# Patient Record
Sex: Female | Born: 1965 | Race: White | Hispanic: No | Smoking: Current every day smoker
Health system: Southern US, Community
[De-identification: ages and names within clinical notes are randomized; demographics above are authoritative.]

## PROBLEM LIST (undated history)

## (undated) DIAGNOSIS — I2510 Atherosclerotic heart disease of native coronary artery without angina pectoris: Secondary | ICD-10-CM

## (undated) DIAGNOSIS — G43909 Migraine, unspecified, not intractable, without status migrainosus: Secondary | ICD-10-CM

## (undated) DIAGNOSIS — Z720 Tobacco use: Secondary | ICD-10-CM

## (undated) DIAGNOSIS — R011 Cardiac murmur, unspecified: Secondary | ICD-10-CM

## (undated) DIAGNOSIS — I10 Essential (primary) hypertension: Secondary | ICD-10-CM

## (undated) DIAGNOSIS — F329 Major depressive disorder, single episode, unspecified: Secondary | ICD-10-CM

## (undated) DIAGNOSIS — M1990 Unspecified osteoarthritis, unspecified site: Secondary | ICD-10-CM

## (undated) DIAGNOSIS — I719 Aortic aneurysm of unspecified site, without rupture: Secondary | ICD-10-CM

## (undated) DIAGNOSIS — F32A Depression, unspecified: Secondary | ICD-10-CM

## (undated) DIAGNOSIS — K219 Gastro-esophageal reflux disease without esophagitis: Secondary | ICD-10-CM

## (undated) DIAGNOSIS — G453 Amaurosis fugax: Secondary | ICD-10-CM

## (undated) DIAGNOSIS — E785 Hyperlipidemia, unspecified: Secondary | ICD-10-CM

## (undated) DIAGNOSIS — G4730 Sleep apnea, unspecified: Secondary | ICD-10-CM

## (undated) DIAGNOSIS — Z419 Encounter for procedure for purposes other than remedying health state, unspecified: Secondary | ICD-10-CM

## (undated) DIAGNOSIS — Z1211 Encounter for screening for malignant neoplasm of colon: Secondary | ICD-10-CM

## (undated) DIAGNOSIS — M545 Low back pain, unspecified: Secondary | ICD-10-CM

## (undated) DIAGNOSIS — K429 Umbilical hernia without obstruction or gangrene: Secondary | ICD-10-CM

## (undated) DIAGNOSIS — Z1231 Encounter for screening mammogram for malignant neoplasm of breast: Secondary | ICD-10-CM

## (undated) DIAGNOSIS — N6325 Unspecified lump in the left breast, overlapping quadrants: Secondary | ICD-10-CM

## (undated) HISTORY — DX: Migraine, unspecified, not intractable, without status migrainosus: G43.909

## (undated) HISTORY — DX: Hyperlipidemia, unspecified: E78.5

## (undated) HISTORY — PX: CHOLECYSTECTOMY: SHX55

## (undated) HISTORY — PX: FRACTURE SURGERY: SHX138

## (undated) HISTORY — DX: Tobacco use: Z72.0

## (undated) HISTORY — DX: Atherosclerotic heart disease of native coronary artery without angina pectoris: I25.10

## (undated) HISTORY — DX: Essential (primary) hypertension: I10

---
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
## 1998-08-22 HISTORY — PX: TUBAL LIGATION: SHX77

## 2007-08-23 HISTORY — PX: CARDIAC CATHETERIZATION: SHX172

## 2009-08-22 DIAGNOSIS — G453 Amaurosis fugax: Secondary | ICD-10-CM

## 2009-08-22 HISTORY — DX: Amaurosis fugax: G45.3

## 2009-08-22 HISTORY — PX: NM MYOVIEW LTD: HXRAD82

## 2011-05-09 ENCOUNTER — Emergency Department (HOSPITAL_COMMUNITY)
Admission: EM | Admit: 2011-05-09 | Discharge: 2011-05-09 | Disposition: A | Discharge status: Home / Self Care | Payer: Self-pay | Attending: Emergency Medicine | Admitting: Emergency Medicine

## 2011-05-09 DIAGNOSIS — I2510 Atherosclerotic heart disease of native coronary artery without angina pectoris: Secondary | ICD-10-CM | POA: Insufficient documentation

## 2011-05-09 DIAGNOSIS — M79609 Pain in unspecified limb: Secondary | ICD-10-CM | POA: Insufficient documentation

## 2011-05-09 DIAGNOSIS — I10 Essential (primary) hypertension: Secondary | ICD-10-CM | POA: Insufficient documentation

## 2011-05-09 DIAGNOSIS — E7800 Pure hypercholesterolemia, unspecified: Secondary | ICD-10-CM | POA: Insufficient documentation

## 2011-05-09 DIAGNOSIS — M542 Cervicalgia: Secondary | ICD-10-CM | POA: Insufficient documentation

## 2011-05-09 DIAGNOSIS — R209 Unspecified disturbances of skin sensation: Secondary | ICD-10-CM | POA: Insufficient documentation

## 2012-01-24 ENCOUNTER — Encounter: Payer: Self-pay | Admitting: Family Medicine

## 2012-01-24 ENCOUNTER — Ambulatory Visit (INDEPENDENT_AMBULATORY_CARE_PROVIDER_SITE_OTHER): Payer: Self-pay | Admitting: Family Medicine

## 2012-01-24 VITALS — BP 139/84 | HR 74 | Temp 98.3°F | Ht 67.0 in | Wt 231.0 lb

## 2012-01-24 DIAGNOSIS — N926 Irregular menstruation, unspecified: Secondary | ICD-10-CM

## 2012-01-24 DIAGNOSIS — F17200 Nicotine dependence, unspecified, uncomplicated: Secondary | ICD-10-CM

## 2012-01-24 DIAGNOSIS — E669 Obesity, unspecified: Secondary | ICD-10-CM

## 2012-01-24 DIAGNOSIS — Z720 Tobacco use: Secondary | ICD-10-CM

## 2012-01-24 DIAGNOSIS — H53129 Transient visual loss, unspecified eye: Secondary | ICD-10-CM

## 2012-01-24 DIAGNOSIS — R0789 Other chest pain: Secondary | ICD-10-CM

## 2012-01-24 DIAGNOSIS — E785 Hyperlipidemia, unspecified: Secondary | ICD-10-CM | POA: Insufficient documentation

## 2012-01-24 DIAGNOSIS — I10 Essential (primary) hypertension: Secondary | ICD-10-CM

## 2012-01-24 DIAGNOSIS — I2510 Atherosclerotic heart disease of native coronary artery without angina pectoris: Secondary | ICD-10-CM

## 2012-01-24 HISTORY — DX: Essential (primary) hypertension: I10

## 2012-01-24 NOTE — Patient Instructions (Signed)
Dear Holly Dunn,   Thank you for coming to clinic today. Please read below regarding the issues that we discussed.   1. High Cholesterol - Will you please come to have your labs drawn in the clinic one morning after fasting overnight? This was we can get an accurate cholesterol check and address that as needed.   2. Transient Blindness - This could be related to your carotid arteries. Therefore, I have ordered a carotid doppler ultrasound to be performed. However, I think it is ok to wait to see if you get financial support from the hospital. Please meet with Rudell Cobb ASAP.   3. Smoking Cessation - Now is a great time to quit smoking! You can be confident knowing that you already successfully quit once. We can talk strategies at an upcoming visit.   Please follow up in clinic in 2 weeks . Please call earlier if you have any questions or concerns.   Sincerely,   Dr. Clinton Sawyer

## 2012-01-26 ENCOUNTER — Encounter: Payer: Self-pay | Admitting: Family Medicine

## 2012-01-26 DIAGNOSIS — R0789 Other chest pain: Secondary | ICD-10-CM | POA: Insufficient documentation

## 2012-01-26 DIAGNOSIS — Z720 Tobacco use: Secondary | ICD-10-CM | POA: Insufficient documentation

## 2012-01-26 DIAGNOSIS — I2510 Atherosclerotic heart disease of native coronary artery without angina pectoris: Secondary | ICD-10-CM

## 2012-01-26 DIAGNOSIS — H53129 Transient visual loss, unspecified eye: Secondary | ICD-10-CM | POA: Insufficient documentation

## 2012-01-26 DIAGNOSIS — N926 Irregular menstruation, unspecified: Secondary | ICD-10-CM | POA: Insufficient documentation

## 2012-01-26 DIAGNOSIS — E669 Obesity, unspecified: Secondary | ICD-10-CM | POA: Insufficient documentation

## 2012-01-26 HISTORY — DX: Atherosclerotic heart disease of native coronary artery without angina pectoris: I25.10

## 2012-01-26 HISTORY — DX: Tobacco use: Z72.0

## 2012-01-26 MED ORDER — ASPIRIN 81 MG PO TBEC: 81.0000 mg | DELAYED_RELEASE_TABLET | Freq: Every day | ORAL | Status: DC

## 2012-01-26 MED ORDER — LISINOPRIL 20 MG PO TABS: 20.0000 mg | ORAL_TABLET | Freq: Every day | ORAL | Status: DC

## 2012-01-26 NOTE — Assessment & Plan Note (Signed)
Patient would like to lose weight. In contemplation phase. Will talk in greater detail about weight loss in upcoming visits.

## 2012-01-26 NOTE — Assessment & Plan Note (Signed)
Continue ASA. Get labs for lipids, TSH, and CMET to look at modifiable risk factors to prevent MI.

## 2012-01-26 NOTE — Assessment & Plan Note (Signed)
Continue lisinopril

## 2012-01-26 NOTE — Progress Notes (Signed)
  Subjective:    Patient ID: Holly Dunn, female    DOB: 06-14-1966, 46 y.o.   MRN: [id number]  HPI  New patient visit:  Patient's Health Goals:  1. Smoking Cessation - most important goal! 2ppd 1996-2008, stopped 2008-2011; restarted at 1 ppd after husband died; 25 pack years 2. Weight Loss - current Body mass index is 36.18 kg/(m^2).  Acute Issues:  1. Patient recently seen at ophthalmologist office Antony Contras, MD) for corneal dystrophy and relayed history of transient monocular blindness in 2011; went untreated at the tim; Dr. Randon Goldsmith now recommending carotid doppler 2. Cholesterol - Had cholesterol checked urgent care and was told total cholesterol > 200, HDL to low and LDL too high but cannot remember numbers; only taking fish oil now 3. Recent Chest Pain - sharp, left sided, not associated with breathing or activity; intermittent lasting seconds but not occurring while in the office and has not occurred since yesterday; happened since 2009, had cardiac cath that showed non obstructive CAD; never had MI or heart failure; denies hx of GERD  PMH: very detailed past medical history and documented in EMR  Review of Systems Negative unless stated in HPI    Objective:   Physical Exam BP 139/84  Pulse 74  Temp(Src) 98.3 F (36.8 C) (Oral)  Ht 5\' 7"  (1.702 m)  Wt 231 lb (104.781 kg)  BMI 36.18 kg/m2 Gen: middle aged WF; obese; non ill appearing; non distressed; very conversant and pleasant HEENT: NCAT, OP clear CV: RRR, no murmurs; no carotid bruits Lungs: CTA-B     Assessment & Plan:  46 year old female presenting today for a new patient visit. Given her obesity, HLD, and HTN, I believe that her biggest benefits from our primary care clinic would be secondary prevention of heart disease and primary prevention of stroke. This will take a multi-faceted approach including smoking cessation, HTN control, lipid reduction, and improved fitness. We will get the ball rolling on that  today with some lab work.

## 2012-01-26 NOTE — Assessment & Plan Note (Signed)
Get carotid doppler to look for plaques. May wait until patient has financial assistance.

## 2012-01-26 NOTE — Assessment & Plan Note (Addendum)
2ppd from 1998-2008. Stopped 2008-2011. Restarted after death of husband. As of February 16, 2012, smoking 1ppd. Approximately 25 pack year history.  - STATES THE SMOKING CESSATION IS HER #1 HEALTH GOAL -Will work on strategies at upcoming appointment and consider referral to Dr. Raymondo Band.

## 2012-01-26 NOTE — Assessment & Plan Note (Signed)
Likely GERD. Consider trial of PPI in future.

## 2012-01-26 NOTE — Assessment & Plan Note (Signed)
Given previously light and short menses and now with 2 months absence of menses, I favor a peri-menopausal period. Continue to follow.

## 2012-01-27 ENCOUNTER — Other Ambulatory Visit: Payer: Self-pay

## 2012-01-27 DIAGNOSIS — I10 Essential (primary) hypertension: Secondary | ICD-10-CM

## 2012-01-27 DIAGNOSIS — E785 Hyperlipidemia, unspecified: Secondary | ICD-10-CM

## 2012-01-27 LAB — COMPREHENSIVE METABOLIC PANEL
AST: 15 U/L (ref 0–37)
Albumin: 4.6 g/dL (ref 3.5–5.2)
Alkaline Phosphatase: 57 U/L (ref 39–117)
Potassium: 4.1 mEq/L (ref 3.5–5.3)
Sodium: 137 mEq/L (ref 135–145)
Total Bilirubin: 0.5 mg/dL (ref 0.3–1.2)
Total Protein: 7.2 g/dL (ref 6.0–8.3)

## 2012-01-27 NOTE — Progress Notes (Signed)
Addended by: [place], Kacey Vicuna on: 01/27/2012 01:34 PM   Modules accepted: Orders

## 2012-01-27 NOTE — Progress Notes (Signed)
Drew CMET, TSH, FLP BAJORDAN, MLS

## 2012-01-28 LAB — LIPID PANEL
LDL Cholesterol: 204 mg/dL — ABNORMAL HIGH (ref 0–99)
Total CHOL/HDL Ratio: 6.2 Ratio
VLDL: 28 mg/dL (ref 0–40)

## 2012-02-09 ENCOUNTER — Ambulatory Visit: Payer: Self-pay | Admitting: Family Medicine

## 2012-02-13 ENCOUNTER — Encounter: Payer: Self-pay | Admitting: Family Medicine

## 2012-02-17 ENCOUNTER — Encounter: Payer: Self-pay | Admitting: Family Medicine

## 2012-02-17 ENCOUNTER — Ambulatory Visit (INDEPENDENT_AMBULATORY_CARE_PROVIDER_SITE_OTHER): Payer: Self-pay | Admitting: Family Medicine

## 2012-02-17 VITALS — BP 112/78 | HR 86 | Ht 67.0 in | Wt 226.0 lb

## 2012-02-17 DIAGNOSIS — E785 Hyperlipidemia, unspecified: Secondary | ICD-10-CM | POA: Insufficient documentation

## 2012-02-17 DIAGNOSIS — M25519 Pain in unspecified shoulder: Secondary | ICD-10-CM

## 2012-02-17 HISTORY — DX: Hyperlipidemia, unspecified: E78.5

## 2012-02-17 MED ORDER — ATORVASTATIN CALCIUM 40 MG PO TABS: 40.0000 mg | ORAL_TABLET | Freq: Every day | ORAL | Status: DC

## 2012-02-17 NOTE — Patient Instructions (Signed)
Dear Holly Dunn,   Thank you for coming to clinic today. Please read below regarding the issues that we discussed.   1. High Cholesterol - Your LDL is 204. Our goal is to get you less than 100. Therefore, we are going to start lipitor at 40 mg a day. Then we will recheck your cholesterol in 3-6 months.  2. Shoulder Pain - I believe that you have a muscle strain of rotator cuff. Please take 2 Alleve for the next 7 days. Also stretch the shoulder so it doesn't get stiff.   Please follow up in clinic in 12 weeks . Please call earlier if you have any questions or concerns.   Sincerely,   Dr. Clinton Sawyer

## 2012-02-17 NOTE — Assessment & Plan Note (Signed)
Based on history and PE, I favor an inflammation of the supraspinatus and/or infraspinatus. We will start with conservative treatment of Naproxen 220 mg BID for 7-10 days.

## 2012-02-17 NOTE — Progress Notes (Signed)
  Subjective:    Patient ID: Holly Dunn, female    DOB: October 11, 1965, 46 y.o.   MRN: [id number]  HPI  Holly Dunn is a 46 year old female with known CAD and currently untreated hyperlipidemia who is here for a follow-up visit.    1. Hyperlipidemia  2. 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, agricultural at [employer]); it is relieved by rest and exacerbated by lift her arm above shoulder height and heaving lifting; she has not tried an anelgesic  Review of Systems Positive for bilateral foot swelling; Negative otherwise    Objective:   Physical Exam BP 112/78  Pulse 86  Ht 5\' 7"  (1.702 m)  Wt 226 lb (102.513 kg)  BMI 35.40 kg/m2 Gen: middle age WF, fatigued appear, very pleasant, alert, oriented x 4, non ill appearing Left Shoulder: normal muscle tone; Sternoclavicular joint non tender; clavicle intact; tenderness to palpation along posterior shoulder joint; no tenderness at [facility] joint; positive painful arc test; painful internal rotation (Gerber's test); painful empty can test; 5/5 strength of deltoids, biceps, and triceps  Right shoulder: normal appearance, normal ROM, non-tender to palpation    Lipid Panel     Component Value Date/Time   CHOL 277* 01/27/2012 1332   TRIG 138 01/27/2012 1332   HDL 45 01/27/2012 1332   CHOLHDL 6.2 01/27/2012 1332   VLDL 28 01/27/2012 1332   LDLCALC 204* 01/27/2012 1332       Assessment & Plan:  46 year old with CAD and hyperlipidemia who needs immediate lipid lowering therapy.

## 2012-02-17 NOTE — Assessment & Plan Note (Signed)
Given her CAD, her goal is less than 100 according to ATP3 or less than 70 according to [facility]. We will start with atorvastatin 40 mg and consider increasing to 80 mg. Recheck lipids in 6-12 weeks after starting therapy.

## 2012-04-20 ENCOUNTER — Ambulatory Visit (INDEPENDENT_AMBULATORY_CARE_PROVIDER_SITE_OTHER): Payer: Self-pay | Admitting: Family Medicine

## 2012-04-20 VITALS — BP 132/69 | HR 89 | Temp 99.4°F | Ht 67.0 in | Wt 237.1 lb

## 2012-04-20 DIAGNOSIS — L259 Unspecified contact dermatitis, unspecified cause: Secondary | ICD-10-CM

## 2012-04-20 MED ORDER — HYDROCORTISONE 1 % EX OINT: TOPICAL_OINTMENT | Freq: Two times a day (BID) | CUTANEOUS | Status: DC

## 2012-04-20 NOTE — Progress Notes (Signed)
Patient ID: Holly Dunn, female   DOB: 22-Oct-1965, 46 y.o.   MRN: [id number] Subjective: The patient is a 46 y.o. year old female who presents today for rash.  1. Rash: present for 1 week.  No obvious exposures.  Rash is pruritic.  Present on left elbow and left thigh (anterior).  Patient has not been working outside.  Is around a dog but doesn't do much interacting with it.  Otherwise no pets.  No other areas of rash.  Has tried OTC anti-itch which works but rash still present.  No fevers/chills/systemic symptoms  Patient's past medical, social, and family history were reviewed and updated as appropriate. History  Substance Use Topics  . Smoking status: Current Everyday Smoker -- 1.0 packs/day    Types: Cigarettes  . Smokeless tobacco: Not on file   Comment: Per pt she is tring to quit  . Alcohol Use: Not on file   Objective:  Filed Vitals:   04/20/12 0838  BP: 132/69  Pulse: 89  Temp: 99.4 F (37.4 C)   Gen: NAD, obese Skin: 10-15 mildly erythematous papules present left elbow.  Evidence of significant excoriation makes further characterization hard.  Left anterior thigh has several connected linear lesions.  There may be small papules present in the lesions, however it is hard to tell as there is significant excoriation and some scab formation present.  No warmth or drainage present.  Assessment/Plan: Contact dermatitis.  Topical steroid x1 week, RTC if not better.  Hopefully steroid will alleviate pruritis enough that better characterization will be possible if rash continues.  Please also see individual problems in problem list for problem-specific plans.

## 2012-04-20 NOTE — Patient Instructions (Signed)
It was great to see you today! I am giving you a prescription for a steroid cream.  Put it on the rash two times per day for the next week.  If you are not doing better by then, come back to see [place].

## 2012-05-10 ENCOUNTER — Ambulatory Visit (INDEPENDENT_AMBULATORY_CARE_PROVIDER_SITE_OTHER): Payer: Self-pay | Admitting: Family Medicine

## 2012-05-10 VITALS — BP 124/84 | HR 90 | Temp 98.3°F | Ht 67.0 in | Wt 238.0 lb

## 2012-05-10 DIAGNOSIS — F17200 Nicotine dependence, unspecified, uncomplicated: Secondary | ICD-10-CM

## 2012-05-10 DIAGNOSIS — G43909 Migraine, unspecified, not intractable, without status migrainosus: Secondary | ICD-10-CM

## 2012-05-10 MED ORDER — KETOROLAC TROMETHAMINE 30 MG/ML IJ SOLN
30.0000 mg | Freq: Once | INTRAMUSCULAR | Status: AC
  Administered 2012-05-10: 30 mg via INTRAMUSCULAR

## 2012-05-10 MED ORDER — ONDANSETRON 4 MG PO TBDP
4.0000 mg | ORAL_TABLET | Freq: Once | ORAL | Status: AC
  Administered 2012-05-10: 4 mg via ORAL

## 2012-05-10 MED ORDER — NAPROXEN 500 MG PO TABS: 500.0000 mg | ORAL_TABLET | Freq: Two times a day (BID) | ORAL | Status: DC

## 2012-05-10 NOTE — Patient Instructions (Addendum)
It was good to meet you today.  I am sorry you are suffering from headaches. - Your headache sounds like a migraine-type headache. - I have included information about this below. - We gave you a dose of injectable toradol and oral zofran in clinic for your headache and nausea. - I prescribed naproxen 500mg  twice daily as needed to your [facility] pharmacy.  Take this as needed and you should not have to take it for more than a couple days with your migraine headache. - If you continue having headaches that are not relieved with the naproxen, please make a follow-up appointment with Dr. Clinton Sawyer. - Congratulations on quitting smoking this month!  Migraine Headache A migraine is very bad pain on one or both sides of your head. The cause of a migraine is not always known. A migraine can be triggered or caused by different things, such as:  Alcohol.   Smoking.   Stress.   Periods (menstruation) in women.   Aged cheeses.   Foods or drinks that contain nitrates, glutamate, aspartame, or tyramine.   Lack of sleep.   Chocolate.   Caffeine.   Hunger.   Medicines, such as nitroglycerine (used to treat chest pain), birth control pills, estrogen, and some blood pressure medicines.  HOME CARE  Many medicines can help migraine pain or keep migraines from coming back. Your doctor can help you decide on a medicine or treatment program.   If you or your child gets a migraine, it may help to lie down in a dark, quiet room.   Keep a headache journal. This may help find out what is causing the headaches. For example, write down:   What you eat and drink.   How much sleep you get.   Any change to your diet or medicines.  GET HELP RIGHT AWAY IF:    The medicine does not work.   The pain begins again.   The neck is stiff.   You have trouble seeing.   The muscles are weak or you lose muscle control.   You have new symptoms.   You lose your balance.   You have trouble walking.    You feel faint or pass out.  MAKE SURE YOU:    Understand these instructions.   Will watch this condition.   Will get help right away if you are not doing well or get worse.  Document Released: 05/17/2008 Document Revised: 07/28/2011 Document Reviewed: 04/13/2009 [facility] Patient Information 2012 [place], [place].

## 2012-05-13 ENCOUNTER — Encounter: Payer: Self-pay | Admitting: Family Medicine

## 2012-05-13 NOTE — Assessment & Plan Note (Signed)
Given clinical picture, most likely migraines, though tension-type and nicotine withdrawal headaches are also possible. - Treat acutely with toradol 30mg  IM injection and zofran odt for nausea in clinic - Naproxen 500mg  BID prn prescribed with instructions to take for a few days at most, with migraines  - Return if increase in frequency or if symptoms do not resolve
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
## 2012-07-26 ENCOUNTER — Ambulatory Visit (INDEPENDENT_AMBULATORY_CARE_PROVIDER_SITE_OTHER): Payer: Self-pay | Admitting: Family Medicine

## 2012-07-26 ENCOUNTER — Observation Stay (HOSPITAL_COMMUNITY): Payer: Self-pay

## 2012-07-26 ENCOUNTER — Observation Stay (HOSPITAL_COMMUNITY)
Admission: AD | Admit: 2012-07-26 | Discharge: 2012-07-27 | Disposition: A | Discharge status: Home / Self Care | Payer: Self-pay | Source: Ambulatory Visit | Attending: Family Medicine | Admitting: Family Medicine

## 2012-07-26 ENCOUNTER — Ambulatory Visit (HOSPITAL_COMMUNITY): Payer: Self-pay | Attending: Family Medicine

## 2012-07-26 ENCOUNTER — Encounter (HOSPITAL_COMMUNITY): Payer: Self-pay | Admitting: Family Medicine

## 2012-07-26 ENCOUNTER — Ambulatory Visit (HOSPITAL_COMMUNITY)
Admission: RE | Admit: 2012-07-26 | Discharge: 2012-07-26 | Disposition: A | Discharge status: Home / Self Care | Payer: Self-pay | Source: Ambulatory Visit | Attending: Family Medicine | Admitting: Family Medicine

## 2012-07-26 ENCOUNTER — Encounter: Payer: Self-pay | Admitting: Family Medicine

## 2012-07-26 VITALS — BP 144/84 | HR 98 | Temp 99.2°F | Ht 67.0 in | Wt 241.0 lb

## 2012-07-26 DIAGNOSIS — R05 Cough: Secondary | ICD-10-CM | POA: Diagnosis present

## 2012-07-26 DIAGNOSIS — R059 Cough, unspecified: Secondary | ICD-10-CM | POA: Insufficient documentation

## 2012-07-26 DIAGNOSIS — R200 Anesthesia of skin: Secondary | ICD-10-CM

## 2012-07-26 DIAGNOSIS — I10 Essential (primary) hypertension: Secondary | ICD-10-CM

## 2012-07-26 DIAGNOSIS — E785 Hyperlipidemia, unspecified: Secondary | ICD-10-CM

## 2012-07-26 DIAGNOSIS — R0789 Other chest pain: Secondary | ICD-10-CM

## 2012-07-26 DIAGNOSIS — I2510 Atherosclerotic heart disease of native coronary artery without angina pectoris: Secondary | ICD-10-CM | POA: Insufficient documentation

## 2012-07-26 DIAGNOSIS — Z79899 Other long term (current) drug therapy: Secondary | ICD-10-CM | POA: Insufficient documentation

## 2012-07-26 DIAGNOSIS — M79609 Pain in unspecified limb: Secondary | ICD-10-CM | POA: Insufficient documentation

## 2012-07-26 DIAGNOSIS — R209 Unspecified disturbances of skin sensation: Secondary | ICD-10-CM | POA: Insufficient documentation

## 2012-07-26 DIAGNOSIS — Z7982 Long term (current) use of aspirin: Secondary | ICD-10-CM | POA: Insufficient documentation

## 2012-07-26 DIAGNOSIS — F17200 Nicotine dependence, unspecified, uncomplicated: Secondary | ICD-10-CM | POA: Insufficient documentation

## 2012-07-26 DIAGNOSIS — Z7902 Long term (current) use of antithrombotics/antiplatelets: Secondary | ICD-10-CM | POA: Insufficient documentation

## 2012-07-26 DIAGNOSIS — M549 Dorsalgia, unspecified: Secondary | ICD-10-CM | POA: Insufficient documentation

## 2012-07-26 LAB — BASIC METABOLIC PANEL
CO2: 25 mEq/L (ref 19–32)
Glucose, Bld: 107 mg/dL — ABNORMAL HIGH (ref 70–99)
Potassium: 3.3 mEq/L — ABNORMAL LOW (ref 3.5–5.1)
Sodium: 142 mEq/L (ref 135–145)

## 2012-07-26 LAB — HEMOGLOBIN A1C
Hgb A1c MFr Bld: 5.4 % (ref <5.7)
Mean Plasma Glucose: 108 mg/dL (ref <117)

## 2012-07-26 LAB — CBC WITH DIFFERENTIAL/PLATELET
Eosinophils Absolute: 0.3 10*3/uL (ref 0.0–0.7)
Hemoglobin: 12.1 g/dL (ref 12.0–15.0)
Lymphs Abs: 2.5 10*3/uL (ref 0.7–4.0)
MCH: 33 pg (ref 26.0–34.0)
Monocytes Relative: 6 % (ref 3–12)
Neutro Abs: 4.3 10*3/uL (ref 1.7–7.7)
Neutrophils Relative %: 56 % (ref 43–77)
RBC: 3.67 MIL/uL — ABNORMAL LOW (ref 3.87–5.11)

## 2012-07-26 LAB — TROPONIN I: Troponin I: <0.3 ng/mL (ref <0.30)

## 2012-07-26 MED ORDER — ASPIRIN 81 MG PO TBEC: 81.0000 mg | DELAYED_RELEASE_TABLET | Freq: Every day | ORAL | Status: DC

## 2012-07-26 MED ORDER — NITROGLYCERIN 0.4 MG SL SUBL: 0.4000 mg | SUBLINGUAL_TABLET | SUBLINGUAL | Status: DC | PRN

## 2012-07-26 MED ORDER — LISINOPRIL 20 MG PO TABS
20.0000 mg | ORAL_TABLET | Freq: Every day | ORAL | Status: DC
  Administered 2012-07-27: 20 mg via ORAL
  Filled 2012-07-26: qty 1

## 2012-07-26 MED ORDER — ASPIRIN 81 MG PO CHEW
324.0000 mg | CHEWABLE_TABLET | ORAL | Status: AC
  Administered 2012-07-26: 324 mg via ORAL

## 2012-07-26 MED ORDER — POTASSIUM CHLORIDE CRYS ER 20 MEQ PO TBCR
40.0000 meq | EXTENDED_RELEASE_TABLET | Freq: Once | ORAL | Status: AC
  Administered 2012-07-26: 40 meq via ORAL
  Filled 2012-07-26: qty 2

## 2012-07-26 MED ORDER — MORPHINE SULFATE 2 MG/ML IJ SOLN: 2.0000 mg | INTRAMUSCULAR | Status: DC | PRN

## 2012-07-26 MED ORDER — ASPIRIN 300 MG RE SUPP: 300.0000 mg | RECTAL | Status: AC

## 2012-07-26 MED ORDER — HEPARIN SODIUM (PORCINE) 5000 UNIT/ML IJ SOLN
5000.0000 [IU] | Freq: Three times a day (TID) | INTRAMUSCULAR | Status: DC
  Administered 2012-07-26 – 2012-07-27 (×2): 5000 [IU] via SUBCUTANEOUS
  Filled 2012-07-26 (×5): qty 1

## 2012-07-26 MED ORDER — ONDANSETRON HCL 4 MG/2ML IJ SOLN: 4.0000 mg | Freq: Four times a day (QID) | INTRAMUSCULAR | Status: DC | PRN

## 2012-07-26 MED ORDER — LISINOPRIL 20 MG PO TABS: 20.0000 mg | ORAL_TABLET | Freq: Every day | ORAL | Status: DC

## 2012-07-26 MED ORDER — ASPIRIN 81 MG PO CHEW
CHEWABLE_TABLET | ORAL | Status: AC
  Administered 2012-07-26: 324 mg via ORAL
  Filled 2012-07-26: qty 4

## 2012-07-26 MED ORDER — METOPROLOL TARTRATE 12.5 MG HALF TABLET
12.5000 mg | ORAL_TABLET | Freq: Two times a day (BID) | ORAL | Status: DC
  Administered 2012-07-27: 12.5 mg via ORAL
  Filled 2012-07-26 (×4): qty 1

## 2012-07-26 MED ORDER — ATORVASTATIN CALCIUM 40 MG PO TABS
40.0000 mg | ORAL_TABLET | Freq: Every day | ORAL | Status: DC
  Filled 2012-07-26: qty 1

## 2012-07-26 MED ORDER — ACETAMINOPHEN 325 MG PO TABS: 650.0000 mg | ORAL_TABLET | ORAL | Status: DC | PRN

## 2012-07-26 MED ORDER — ASPIRIN EC 81 MG PO TBEC
81.0000 mg | DELAYED_RELEASE_TABLET | Freq: Every day | ORAL | Status: DC
  Administered 2012-07-27: 81 mg via ORAL
  Filled 2012-07-26: qty 1

## 2012-07-26 NOTE — Progress Notes (Signed)
Pt direct admit from office. Arrived to floor complaining of 5/10 intermittent chest pain that has been coming and going all day. Pt states that pain is sharp and hurts more with deep breaths and when she coughs. 324 baby aspirin given per admission orders. EKG obtained- pt remains NSR in 70-80s. VSS. MD paged to make aware. Will continue to monitor. Levonne Spiller, RN

## 2012-07-26 NOTE — Progress Notes (Signed)
Patient arrived to floor, in bed, comfortable at the time I saw her.  She has been having intermittent chest pain throughout the day.  Starts in the back and radiates to the front, sharp in nature and worse with coughing.  She also has some tingling feeling in L hand that is still present.  Will monitor overnight for cardiac rule out as she has risk factors for MI, although her pain is atypical.  Will also order CXR given cough and chest pain.

## 2012-07-26 NOTE — H&P (Signed)
Family Medicine Teaching Service History and Physical Chief Complaint: Chest pain and left arm numbness/tingling.   HPI: 46 year old female with PMHx of non-occlusive CAD, HTN, HLD, Tobacco abuse presents to clinic with left sided chest pain that radiates to her back, and numbness and tingling in her left arm. 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  She was not exerting herself and was not under any emotional stress.  Nothing in particular makes it better or worse.  She has not had any change in physical activity or injuries that could cause chest wall pain. She does not have a history of GERD.  She denies nausea, vomiting, diaphoresis, exertional worsening of chest pain, or improvement of chest pain with rest.    Past Medical History  Diagnosis Date  . Benign essential HTN Feb 02, 2012  . Tobacco abuse 01/26/2012    2ppd from 1998-2008. Stopped 2008-2011. Restarted after death of husband. As of 02-02-2012, smoking 1ppd. Approximately 25 pack year history. Would like to quit.   . Coronary artery disease, non-occlusive 01/26/2012    Cardiac cath in 2009 performed by Dr. Virgel Manifold in [place], [place]. 40% stenosis of LAD proximal to 2nd diagonal. EF 55%  . Right hand fracture 2011  . Closed right ankle fracture 1979  . Hyperlipidemia LDL goal < 100 02/17/2012  . Migraine 05-10-12    Past Surgical History  Procedure Date  . Tonsillectomy and adenoidectomy 1970  . Nm myoview ltd 2011    normal perfusion, no iscemia, EF 54%  . Cardiac catheterization 2009    40% LAD stenosis at one junction; 40% stenosis if high obtuse marginal off l. circ  . Amaurosis 2011    Family History  Problem Relation Age of Onset  . Heart disease Mother 20    First MI  . Hypertension Mother   . Kidney disease Mother   . Hyperlipidemia Mother    Social History:  reports that she has quit smoking. Her smoking use included Cigarettes. She quit smokeless tobacco use about 2 months  ago. Her alcohol and drug histories not on file.  Allergies: No Known Allergies  Medications Prior to Admission  Medication Sig Dispense Refill  . aspirin 81 MG EC tablet Take 1 tablet (81 mg total) by mouth daily. Swallow whole.  30 tablet  12  . atorvastatin (LIPITOR) 40 MG tablet Take 1 tablet (40 mg total) by mouth daily.  90 tablet  3  . lisinopril (PRINIVIL,ZESTRIL) 20 MG tablet Take 1 tablet (20 mg total) by mouth daily.  60 tablet  0  . naproxen (NAPROSYN) 500 MG tablet Take 1 tablet (500 mg total) by mouth 2 (two) times daily with a meal.  30 tablet  1  . hydrocortisone 1 % ointment Apply topically 2 (two) times daily.  30 g  0   ROS All systems negative except stated in HPI Physical Exam  Vitals: BP 144/84, P 98, Temp 99.2, Weight 241 lb,  ECG: NSR, Rate 86, normal axis, Normal intrevals, non-specific ST and T-wave abnormalities.  There were no vitals taken for this visit. General appearance: alert, cooperative and no distress Neck: no carotid bruit, no JVD, supple, symmetrical, trachea midline and thyroid not enlarged, symmetric, no tenderness/mass/nodules Lungs: clear to auscultation bilaterally Heart: 2/6 Systolic murmur, best heard at right sternal border Extremities: extremities normal, atraumatic, no cyanosis or edema Pulses: 2+ and symmetric  Assessment/Plan 46 year old female with  PMHx of non-occlusive CAD,  HTN, HLD, 20 pack year history of smoking, and family history of early MI who presents with atypical, non-exertional chest pain that began this morning  #1 Chest pain: ECG with non-specific ST changes, no evidence of ischemia, given history, will admit to observation for ACS rule out.  Cycle cardiac enzymes, ASA, Nitro, repeat ECG in am.  Will consider Cardiology consult tomorrow morning. Add beta blocker to home lisinopril considering elevated BP and chest pain.   #2 ? New Cardiac Murmur: Noted on exam today, patient does not know of having a murmur in the past,  and it has not been documented in Epic today.  Will obtain BNP, consider Echocardiogram.   #3 HTN: continue home lisinopril, add metoprolol.   # 4 HLD: Continue Atorvastatin, last lipids in 01/2012, Total cholesterol 277, LDL was 204, will repeat lipid profile tomorrow morning.   #5 FEN/GI: Will place saline lock, keep patient NPO until first troponin results are back, then allow heart health diet.  Zofran PRN for nausea.   #6 DVT PPX- SQ heparin TID  #7 Disposition- admit to observation for ACS rule out.   Britanie Harshman 07/26/2012, 4:21 PM

## 2012-07-26 NOTE — H&P (Signed)
FMTS Attending Admission Note: Renold Don MD Personal pager:  [phone] FPTS Service Pager:  [phone]  I  have seen and examined this patient, reviewed their chart. I have discussed this patient with the resident. I agree with the resident's findings, assessment and care plan.  Briefly, 46 yo F with CAD and normal Myoview/cath showing 40% LAD stenosis in 2011, HTN, HLD, and 20 PYH tobacco abuse with 1 day history of Left sided chest pain and Left sided paresthesias.  Has had prior admit with negative work-up in past.  Mother had MI age 69.  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  Describes as steady pain, radiates to back.  No N/V/diaphoresis, dyspnea.  EKG with nonspecific T wave inversions.    Exam:  Gen:  Comfortable, sitting on exam table Heart:  RRR with Grade III/VI SEM noted Lungs:  Clear throughout Ext:  No edema noted  Imp/Plan:   1.  Chest pain in patient with strong family history, HLD (LDL >200), and tobacco abuse:  - Agree with admission.   - Agree with CE's, ASA, nitro, and repeat EKG - recommend cardiology prior to DC home due to strong family history - Agree with addition of Beta blocker - Continue statin   2.  Question of new murmur: - Not likely secondary to papillary necrosis - EF 54% 2011 Myoview - Consider repeat Echo either in house or outpatient.   - No signs of clinical heart failure.

## 2012-07-26 NOTE — Progress Notes (Signed)
Patient presented to clinic complaining of chest pain and left arm numbness.  She was direct admitted from clinic, for full details, please see History and Physical.

## 2012-07-27 DIAGNOSIS — R05 Cough: Secondary | ICD-10-CM | POA: Diagnosis present

## 2012-07-27 DIAGNOSIS — R0789 Other chest pain: Secondary | ICD-10-CM | POA: Diagnosis present

## 2012-07-27 LAB — BASIC METABOLIC PANEL
BUN: 8 mg/dL (ref 6–23)
CO2: 26 mEq/L (ref 19–32)
Calcium: 8.9 mg/dL (ref 8.4–10.5)
Chloride: 106 mEq/L (ref 96–112)
Creatinine, Ser: 0.82 mg/dL (ref 0.50–1.10)

## 2012-07-27 LAB — LIPID PANEL
Total CHOL/HDL Ratio: 3.3 RATIO
VLDL: 22 mg/dL (ref 0–40)

## 2012-07-27 LAB — CBC
HCT: 34.5 % — ABNORMAL LOW (ref 36.0–46.0)
MCH: 32.8 pg (ref 26.0–34.0)
MCV: 98.3 fL (ref 78.0–100.0)
RDW: 12.5 % (ref 11.5–15.5)
WBC: 6.8 10*3/uL (ref 4.0–10.5)

## 2012-07-27 NOTE — Progress Notes (Signed)
UR Completed Lionel Woodberry Graves-Bigelow, RN,BSN [phone]  

## 2012-07-27 NOTE — Discharge Summary (Signed)
Physician Discharge Summary  Patient ID: Holly Dunn MRN: [id number] DOB/AGE: 46-Nov-1967 46 y.o.  Admit date: 07/26/2012 Discharge date: 07/27/2012  Admission Diagnoses:  Discharge Diagnoses:  Active Problems:  Atypical chest pain  Dry cough   Discharged Condition: good  Hospital Course:  HPI: 46 year old female with PMHx of non-occlusive CAD, HTN, HLD, Tobacco abuse admitted after experiencing atypical chest pain and numbness tingling in her left arm during her clinic appointment.  The pain radiated around her back on the left side. During her admission she received morphine for pain and responded well. Potassium was re pleated due to a level of 3.3 and ASA was administered. Patient received a CXR and  EKG which were both normal. Troponin I was cycled x3 and negative.   There was some concern in the office for a possible new onset murmur. At this time it is believed to not likely secondary to papillary necrosis. Her EF was  54% in 2011 by Myoview. Consider repeat Echo outpatient. There were no signs of clinical heart failure during her hospital course.    Consults: none  Significant Diagnostic Studies:  CXR: 07/26/2012  *RADIOLOGY REPORT*  IMPRESSION: No active lung disease.   Original Report Authenticated By: Dwyane Dee, M.D.   EKG: WNL   Treatments: IV hydration and potassium, analgesia: Morphine  Discharge Exam: Blood pressure 112/75, pulse 63, temperature 97.6 F (36.4 C), temperature source Oral, resp. rate 18, height 5\' 7"  (1.702 m), weight 239 lb 9.6 oz (108.682 kg), SpO2 98.00%. Physical Exam:  Gen: Afebrile. VSS. On room air. NAD.  CV: RRR. 1/6 SM.  Lungs: CTAB. No wheezing, rhonchi or rales.  Abd: Soft. NT.ND. No HSM  EXT: NT. No erythema. No edema.   Disposition: 01-Home or Self Care      Discharge Orders    Future Appointments: Provider: Department: Dept Phone: Center:   08/01/2012 2:15 PM Garnetta Buddy, MD [facility]  [phone] [facility]     Future Orders Please Complete By Expires   Increase activity slowly      Discharge instructions      Comments:   Please follow-up with your PCP at the appointment time already made for you. If you can not make this time then please call the office >48 hours prior to cancel. It is believed your pain was from excessive cough due to an upper respiratory infection. If the pain because worse or uncontrollable please call your PCP or go to ER.   Call MD for:  temperature >100.4          Medication List     As of 07/27/2012 11:51 AM    TAKE these medications         aspirin 81 MG EC tablet   Take 1 tablet (81 mg total) by mouth daily. Swallow whole.      atorvastatin 40 MG tablet   Commonly known as: LIPITOR   Take 1 tablet (40 mg total) by mouth daily.      FISH OIL/D3 ADULT GUMMIES 184-250 MG-UNIT Chew   Chew 2 capsules by mouth daily.      lisinopril 20 MG tablet   Commonly known as: PRINIVIL,ZESTRIL   Take 1 tablet (20 mg total) by mouth daily.      multivitamin tablet   Take 1 tablet by mouth daily.      naproxen 500 MG tablet   Commonly known as: NAPROSYN   Take 1 tablet (500 mg total) by mouth 2 (  two) times daily with a meal.        Follow-up Information    Follow up with Mat Carne, MD. On 08/01/2012. (@2 :15)    Contact information:   [address] [place] [address] [phone]          Signed: Felix Pacini 07/27/2012, 11:51 AM

## 2012-07-27 NOTE — Progress Notes (Signed)
   CARE MANAGEMENT NOTE 07/27/2012  Patient:  Holly Dunn, Holly Dunn   Account Number:  [id number]  Date Initiated:  07/27/2012  Documentation initiated by:  GRAVES-BIGELOW,Bridgit Eynon  Subjective/Objective Assessment:   Pt admitted with cp. Plan for home today.     Action/Plan:   CM will continue ot monitor for disposition needs.   Anticipated DC Date:  07/27/2012   Anticipated DC Plan:  HOME/SELF CARE      DC Planning Services  CM consult      Choice offered to / List presented to:             Status of service:  Completed, signed off Medicare Important Message given?   (If response is "NO", the following Medicare IM given date fields will be blank) Date Medicare IM given:   Date Additional Medicare IM given:    Discharge Disposition:  HOME/SELF CARE  Per UR Regulation:  Reviewed for med. necessity/level of care/duration of stay  If discussed at Long Length of Stay Meetings, dates discussed:    Comments:

## 2012-07-27 NOTE — Progress Notes (Signed)
Brief Progress Note  As the patient's PCP, I stopped by to visit. She is well appearing and preparing for discharge. I look forward to seeing her for a follow up on 08/01/12 and appreciate the efforts of the entire inpatient care team.   Si Raider. Clinton Sawyer, MD, MBA 07/27/2012, 12:49 PM Family Medicine Resident, PGY-2 [phone] pager

## 2012-07-27 NOTE — Discharge Summary (Signed)
FMTS Attending Note Patient seen and examined by me, she reports improvement in her chest pain.  Case discussed with resident team and I agree with assess/plan for discharge from hospital today.  Paula Compton, MD

## 2012-08-01 ENCOUNTER — Other Ambulatory Visit: Payer: Self-pay | Admitting: Family Medicine

## 2012-08-01 ENCOUNTER — Ambulatory Visit (INDEPENDENT_AMBULATORY_CARE_PROVIDER_SITE_OTHER): Payer: Self-pay | Admitting: Family Medicine

## 2012-08-01 ENCOUNTER — Encounter: Payer: Self-pay | Admitting: Family Medicine

## 2012-08-01 VITALS — BP 130/80 | HR 86 | Ht 67.0 in | Wt 239.0 lb

## 2012-08-01 DIAGNOSIS — H53129 Transient visual loss, unspecified eye: Secondary | ICD-10-CM

## 2012-08-01 DIAGNOSIS — R222 Localized swelling, mass and lump, trunk: Secondary | ICD-10-CM | POA: Insufficient documentation

## 2012-08-01 DIAGNOSIS — F17200 Nicotine dependence, unspecified, uncomplicated: Secondary | ICD-10-CM

## 2012-08-01 DIAGNOSIS — R011 Cardiac murmur, unspecified: Secondary | ICD-10-CM

## 2012-08-01 DIAGNOSIS — R229 Localized swelling, mass and lump, unspecified: Secondary | ICD-10-CM

## 2012-08-01 DIAGNOSIS — Z23 Encounter for immunization: Secondary | ICD-10-CM

## 2012-08-01 DIAGNOSIS — Z720 Tobacco use: Secondary | ICD-10-CM

## 2012-08-01 DIAGNOSIS — Z1231 Encounter for screening mammogram for malignant neoplasm of breast: Secondary | ICD-10-CM

## 2012-08-01 DIAGNOSIS — I2510 Atherosclerotic heart disease of native coronary artery without angina pectoris: Secondary | ICD-10-CM

## 2012-08-01 MED ORDER — TETANUS-DIPHTH-ACELL PERTUSSIS 5-2.5-18.5 LF-MCG/0.5 IM SUSP: 0.5000 mL | Freq: Once | INTRAMUSCULAR | Status: DC

## 2012-08-01 NOTE — Assessment & Plan Note (Signed)
First noted in the hospital on Dec 2013. It is currently a 2-3/6 SEM consistent with aortic stenosis. Currently, she is completely asymptomatic and BP is well controlled. However, I would like an echo in the near future.

## 2012-08-01 NOTE — Assessment & Plan Note (Signed)
Likely cyst. Will get mammogram. Consider ultrasound and removal/biopsy as needed.

## 2012-08-01 NOTE — Assessment & Plan Note (Signed)
Currently quit. Great job. Continue to encourage.

## 2012-08-01 NOTE — Progress Notes (Signed)
  Subjective:    Patient ID: Holly Dunn, female    DOB: 1965/10/26, 46 y.o.   MRN: [id number]  HPI  46 year old F who was recently hospitalized for atypical chest pain and was cleared of ACS presents today for follow-up. She denies any chest pain.   Left Arm Tingling - Currently, denies any chest pain but has tingling in LUE that extends to her fingers. This has been present for 1 week. She denies weakness in the arm. 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   Tobacco Use - Stopped 1 month ago; Hx of > 25 pack years  Skin Nodule - firm, located medial to right breast, mobile, non-tender, 2 years duration, no change in size, no other similar nodules but has hx of cyst in left axilla, > 2 years since previous mammogram  Review of Systems Positive for cough, intermittent dizziness Negative for chest pain, SOB, dyspnea on exertion, fever, breast mass     Objective:   Physical Exam BP 130/80  Pulse 86  Ht 5\' 7"  (1.702 m)  Wt 239 lb (108.41 kg)  BMI 37.43 kg/m2 Gen: middle aged WF, obese, non ill appearing, NAD CV:2/6 SEM, no carotid bruits Chest wall: Chest wall nodule, 0.25-0.5 cm, firm, mobile, non-tender Lungs: CTA-B Neck: decreased right and left rotation, negative spurlings test bilat LUE: normal ROM, 5/5 strength     Assessment & Plan:  46 year old F with follow up hospitalization for non-cardiac chest pain who presents with numerous other concerns. She will continue her current medications, have a mammogram performed, and after working on financial assistance, she will return for cervical cancer screening, eval for carotid dopplers, and a cardiac echo.

## 2012-08-01 NOTE — Assessment & Plan Note (Signed)
BP and lipids well controlled onlisinopril 20 and Lipitor 40 mg. Continue these. Also stopped smoking. Framingham risk < 5%. Continue ASA.

## 2012-08-01 NOTE — Patient Instructions (Signed)
Dear Holly Dunn,   Thank you for coming to clinic today. Please read below regarding the issues that we discussed.   1. Left arm tingling - I don't know the cause of this. This may be related to your neck. However, it is something that can be observed since it is not affecting your strength.   2. Chest wall nodule - This feels like a cyst and does not seem to be a part of the breast tissue. You do need to have a mammogram however to check your breast tissue.   3. Left shoulder pain - Follow up for a shoulder injection. You seem to have a rotator cuff injury.   4. Pap Smear - This should be done as routine screening in the near future.   Good job with not smoking! Keep it up. Also, try to get some exercise.   Please follow up in clinic in 1 month. Please call earlier if you have any questions or concerns.   Sincerely,   Dr. Clinton Sawyer

## 2012-08-01 NOTE — Assessment & Plan Note (Signed)
Patient needs carotid ultrasound. Cont statin for LDL control.

## 2012-08-16 ENCOUNTER — Ambulatory Visit: Payer: Self-pay | Admitting: Family Medicine

## 2012-08-16 ENCOUNTER — Emergency Department (HOSPITAL_COMMUNITY)
Admission: EM | Admit: 2012-08-16 | Discharge: 2012-08-17 | Disposition: A | Discharge status: Home / Self Care | Payer: Self-pay | Attending: Emergency Medicine | Admitting: Emergency Medicine

## 2012-08-16 ENCOUNTER — Ambulatory Visit (INDEPENDENT_AMBULATORY_CARE_PROVIDER_SITE_OTHER): Payer: Self-pay | Admitting: Family Medicine

## 2012-08-16 ENCOUNTER — Encounter (HOSPITAL_COMMUNITY): Payer: Self-pay | Admitting: *Deleted

## 2012-08-16 VITALS — BP 130/82 | HR 80 | Temp 98.0°F | Ht 67.0 in | Wt 240.0 lb

## 2012-08-16 DIAGNOSIS — Z8679 Personal history of other diseases of the circulatory system: Secondary | ICD-10-CM | POA: Insufficient documentation

## 2012-08-16 DIAGNOSIS — Z79899 Other long term (current) drug therapy: Secondary | ICD-10-CM | POA: Insufficient documentation

## 2012-08-16 DIAGNOSIS — L0390 Cellulitis, unspecified: Secondary | ICD-10-CM

## 2012-08-16 DIAGNOSIS — M79609 Pain in unspecified limb: Secondary | ICD-10-CM

## 2012-08-16 DIAGNOSIS — Z8781 Personal history of (healed) traumatic fracture: Secondary | ICD-10-CM | POA: Insufficient documentation

## 2012-08-16 DIAGNOSIS — Z87891 Personal history of nicotine dependence: Secondary | ICD-10-CM | POA: Insufficient documentation

## 2012-08-16 DIAGNOSIS — I10 Essential (primary) hypertension: Secondary | ICD-10-CM | POA: Insufficient documentation

## 2012-08-16 DIAGNOSIS — Z9861 Coronary angioplasty status: Secondary | ICD-10-CM | POA: Insufficient documentation

## 2012-08-16 DIAGNOSIS — I2510 Atherosclerotic heart disease of native coronary artery without angina pectoris: Secondary | ICD-10-CM | POA: Insufficient documentation

## 2012-08-16 DIAGNOSIS — W57XXXA Bitten or stung by nonvenomous insect and other nonvenomous arthropods, initial encounter: Secondary | ICD-10-CM

## 2012-08-16 DIAGNOSIS — T148 Other injury of unspecified body region: Secondary | ICD-10-CM

## 2012-08-16 DIAGNOSIS — IMO0002 Reserved for concepts with insufficient information to code with codable children: Secondary | ICD-10-CM | POA: Insufficient documentation

## 2012-08-16 DIAGNOSIS — E785 Hyperlipidemia, unspecified: Secondary | ICD-10-CM | POA: Insufficient documentation

## 2012-08-16 DIAGNOSIS — Z7982 Long term (current) use of aspirin: Secondary | ICD-10-CM | POA: Insufficient documentation

## 2012-08-16 LAB — CBC WITH DIFFERENTIAL/PLATELET
Basophils Absolute: 0.1 10*3/uL (ref 0.0–0.1)
Basophils Relative: 1 % (ref 0–1)
Eosinophils Absolute: 0.5 10*3/uL (ref 0.0–0.7)
Eosinophils Relative: 6 % — ABNORMAL HIGH (ref 0–5)
Hemoglobin: 13.7 g/dL (ref 12.0–15.0)
Monocytes Absolute: 0.5 10*3/uL (ref 0.1–1.0)
Monocytes Relative: 6 % (ref 3–12)
Neutrophils Relative %: 54 % (ref 43–77)
Platelets: 305 10*3/uL (ref 150–400)
RBC: 4.11 MIL/uL (ref 3.87–5.11)
WBC: 8.3 10*3/uL (ref 4.0–10.5)

## 2012-08-16 LAB — COMPREHENSIVE METABOLIC PANEL
AST: 19 U/L (ref 0–37)
Albumin: 4.2 g/dL (ref 3.5–5.2)
BUN: 9 mg/dL (ref 6–23)
Calcium: 9.8 mg/dL (ref 8.4–10.5)
Creatinine, Ser: 0.85 mg/dL (ref 0.50–1.10)

## 2012-08-16 MED ORDER — VANCOMYCIN HCL IN DEXTROSE 1-5 GM/200ML-% IV SOLN
1000.0000 mg | Freq: Once | INTRAVENOUS | Status: AC
  Administered 2012-08-16: 1000 mg via INTRAVENOUS
  Filled 2012-08-16: qty 200

## 2012-08-16 MED ORDER — DOXYCYCLINE HYCLATE 100 MG PO TABS: 100.0000 mg | ORAL_TABLET | Freq: Two times a day (BID) | ORAL | Status: DC

## 2012-08-16 NOTE — Progress Notes (Signed)
  Subjective:    Patient ID: Holly Dunn, female    DOB: 04-Nov-1965, 46 y.o.   MRN: [id number]  HPI  1 day of right ebow skin infection  Notes itchy skin, has winter itch and had been scratching.  Went to work today and by the morning, noted area near elbow was swollen.  No known insect exposure.  Works at a [employer].  No fever, chills.   Review of Systems    see HPI Objective:   Physical Exam GEN: NAD Skin:  Right inner elbow area- one scab and superior to it, two small excoriations.  ? Beginning vesicles around the area of concern.  Area marked       Assessment & Plan:

## 2012-08-16 NOTE — ED Provider Notes (Signed)
History     CSN: [id number]  Arrival date & time 08/16/12  1836   First MD Initiated Contact with Patient 08/16/12 2205      Chief Complaint  Patient presents with  . Cellulitis    (Consider location/radiation/quality/duration/timing/severity/associated sxs/prior treatment) HPI Comments: This 46 year old female, who presents emergency department with chief complaint of cellulitis to her right arm. Patient was seen earlier today by her primary care, and was diagnosed with cellulitis and discharged with doxycycline. Patient states that yesterday she began noticing redness near her right elbow as well as 2 small lesions. Patient states that the redness has continued to spread. Patient reports pain with movement of the joint. She states that her symptoms are moderate. There are no aggravating or alleviating factors.  The pain does not radiate.  The history is provided by the patient. No language interpreter was used.    Past Medical History  Diagnosis Date  . Benign essential HTN 01/26/2012  . Tobacco abuse 01/26/2012    2ppd from 1998-2008. Stopped 2008-2011. Restarted after death of husband. As of 2012/01/26, smoking 1ppd. Approximately 25 pack year history. Would like to quit.   . Coronary artery disease, non-occlusive 01/26/2012    Cardiac cath in 2009 performed by Dr. Virgel Manifold in [place], [place]. 40% stenosis of LAD proximal to 2nd diagonal. EF 55%  . Right hand fracture 2011  . Closed right ankle fracture 1979  . Hyperlipidemia LDL goal < 100 02/17/2012  . Migraine 05-10-12    Past Surgical History  Procedure Date  . Tonsillectomy and adenoidectomy 1970  . Nm myoview ltd 2011    normal perfusion, no iscemia, EF 54%  . Cardiac catheterization 2009    40% LAD stenosis at one junction; 40% stenosis if high obtuse marginal off l. circ  . Amaurosis 2011    Family History  Problem Relation Age of Onset  . Heart disease Mother 49    First MI  . Hypertension Mother   . Kidney  disease Mother   . Hyperlipidemia Mother     History  Substance Use Topics  . Smoking status: Former Smoker    Types: Cigarettes  . Smokeless tobacco: Former Neurosurgeon    Quit date: 05/06/2012     Comment: Per pt she is tring to quit  . Alcohol Use: Not on file    OB History    Grav Para Term Preterm Abortions TAB SAB Ect Mult Living                  Review of Systems  All other systems reviewed and are negative.    Allergies  Review of patient's allergies indicates no known allergies.  Home Medications   Current Outpatient Rx  Name  Route  Sig  Dispense  Refill  . ASPIRIN 81 MG PO TBEC   Oral   Take 1 tablet (81 mg total) by mouth daily. Swallow whole.   30 tablet   12   . ATORVASTATIN CALCIUM 40 MG PO TABS   Oral   Take 1 tablet (40 mg total) by mouth daily.   90 tablet   3   . DOXYCYCLINE HYCLATE 100 MG PO TABS   Oral   Take 1 tablet (100 mg total) by mouth 2 (two) times daily. For 7 days   14 tablet   0   . FISH OIL/D3 ADULT GUMMIES 184-250 MG-UNIT PO CHEW   Oral   Chew 2 capsules by mouth daily.         Marland Kitchen  LISINOPRIL 20 MG PO TABS   Oral   Take 1 tablet (20 mg total) by mouth daily.   60 tablet   0   . ONE-DAILY MULTI VITAMINS PO TABS   Oral   Take 1 tablet by mouth daily.         Marland Kitchen NAPROXEN 500 MG PO TABS   Oral   Take 500 mg by mouth 2 (two) times daily as needed. For shoulder pain           BP 158/80  Pulse 88  Temp 98.6 F (37 C) (Oral)  Resp 18  SpO2 99%  Physical Exam  Nursing note and vitals reviewed. Constitutional: She is oriented to person, place, and time. She appears well-developed and well-nourished.  HENT:  Head: Normocephalic and atraumatic.  Right Ear: External ear normal.  Left Ear: External ear normal.  Eyes: Conjunctivae normal and EOM are normal. Pupils are equal, round, and reactive to light.  Neck: Normal range of motion. Neck supple.  Cardiovascular: Normal rate, regular rhythm and normal heart sounds.   Exam reveals no gallop and no friction rub.   No murmur heard. Pulmonary/Chest: Effort normal and breath sounds normal. No respiratory distress. She has no wheezes. She has no rales. She exhibits no tenderness.  Abdominal: Soft. Bowel sounds are normal. She exhibits no distension and no mass. There is no tenderness. There is no rebound and no guarding.  Musculoskeletal: Normal range of motion. She exhibits no edema and no tenderness.  Neurological: She is alert and oriented to person, place, and time.  Skin: Skin is warm and dry.          Redness over right elbow, with 2 small lesions approximately 2-3cm in diameter.  Psychiatric: She has a normal mood and affect. Her behavior is normal. Judgment and thought content normal.    ED Course  Procedures (including critical care time)  Labs Reviewed  CBC WITH DIFFERENTIAL - Abnormal; Notable for the following:    Eosinophils Relative 6 (*)     All other components within normal limits  COMPREHENSIVE METABOLIC PANEL   Results for orders placed during the hospital encounter of 08/16/12  CBC WITH DIFFERENTIAL      Component Value Range   WBC 8.3  4.0 - 10.5 K/uL   RBC 4.11  3.87 - 5.11 MIL/uL   Hemoglobin 13.7  12.0 - 15.0 g/dL   HCT 16.1  09.6 - 04.5 %   MCV 97.1  78.0 - 100.0 fL   MCH 33.3  26.0 - 34.0 pg   MCHC 34.3  30.0 - 36.0 g/dL   RDW 40.9  81.1 - 91.4 %   Platelets 305  150 - 400 K/uL   Neutrophils Relative 54  43 - 77 %   Neutro Abs 4.5  1.7 - 7.7 K/uL   Lymphocytes Relative 34  12 - 46 %   Lymphs Abs 2.8  0.7 - 4.0 K/uL   Monocytes Relative 6  3 - 12 %   Monocytes Absolute 0.5  0.1 - 1.0 K/uL   Eosinophils Relative 6 (*) 0 - 5 %   Eosinophils Absolute 0.5  0.0 - 0.7 K/uL   Basophils Relative 1  0 - 1 %   Basophils Absolute 0.1  0.0 - 0.1 K/uL  COMPREHENSIVE METABOLIC PANEL      Component Value Range   Sodium 138  135 - 145 mEq/L   Potassium 4.0  3.5 - 5.1 mEq/L   Chloride 101  96 - 112 mEq/L   CO2 27  19 - 32 mEq/L     Glucose, Bld 103 (*) 70 - 99 mg/dL   BUN 9  6 - 23 mg/dL   Creatinine, Ser 9.14  0.50 - 1.10 mg/dL   Calcium 9.8  8.4 - 78.2 mg/dL   Total Protein 7.5  6.0 - 8.3 g/dL   Albumin 4.2  3.5 - 5.2 g/dL   AST 19  0 - 37 U/L   ALT 26  0 - 35 U/L   Alkaline Phosphatase 88  39 - 117 U/L   Total Bilirubin 0.5  0.3 - 1.2 mg/dL   GFR calc non Af Amer 81 (*) >90 mL/min   GFR calc Af Amer >90  >90 mL/min   Dg Chest 2 View  07/26/2012  *RADIOLOGY REPORT*  Clinical Data: Cough for 3 weeks, chest pain  CHEST - 2 VIEW  Comparison: None.  Findings: No active infiltrate or effusion is seen.  Mediastinal contours appear normal.  The heart is within normal limits in size. No bony abnormality is seen.  IMPRESSION: No active lung disease.   Original Report Authenticated By: Dwyane Dee, M.D.       1. Cellulitis       MDM  This is a 46 year old female with cellulitis. Patient has been seen by Dr. Effie Shy, as well as family practice residents. Patient has received 1 g of vancomycin while in the emergency department. She will be discharged to home with followup tomorrow morning with family practice residents. She understands and agrees with the plan. She is stable and ready for discharge.        Roxy Horseman, PA-C 08/17/12 [phone]

## 2012-08-16 NOTE — Consult Note (Signed)
Family Medicine Teaching Service Consult Note  Holly Dunn is 46 y.o. F presenting to the ED for worsening of right arm redness/swelling.  Patient was seen in the clinic this afternoon, with Dr. Earnest Bailey, for a probable spider bite with redness, itching and swelling of 1 day. She was given a prescription of Doxycycline and has taken one dose, at 3 pm today. Dr. Earnest Bailey had marked the surrounding areas of erythema and instructed the patient if it had worsened to come into the ED to be seen. The area of erythema had spread out past marked surroundings by as much as an inch in some areas. Patient denies chills, nausea or N/V. She has never had a reaction like this prior and no other family members were bitten, or have similar rash.  While in the ED, she has been started on one dose of IV Vancomycin. She has not changed much clinically other than the spreading erythema.   Past Medical History  Diagnosis Date  . Benign essential HTN February 23, 2012  . Tobacco abuse 01/26/2012    2ppd from 1998-2008. Stopped 2008-2011. Restarted after death of husband. As of 23-Feb-2012, smoking 1ppd. Approximately 25 pack year history. Would like to quit.   . Coronary artery disease, non-occlusive 01/26/2012    Cardiac cath in 2009 performed by Dr. Virgel Manifold in [place], [place]. 40% stenosis of LAD proximal to 2nd diagonal. EF 55%  . Right hand fracture 2011  . Closed right ankle fracture 1979  . Hyperlipidemia LDL goal < 100 02/17/2012  . Migraine 05-10-12   Heart Mumur Past Surgical History  Procedure Date  . Tonsillectomy and adenoidectomy 1970  . Nm myoview ltd 2011    normal perfusion, no iscemia, EF 54%  . Cardiac catheterization 2009    40% LAD stenosis at one junction; 40% stenosis if high obtuse marginal off l. circ  . Amaurosis 2011   History   Social History  . Marital Status: Widowed    Spouse Name: N/A    Number of Children: N/A  . Years of Education: N/A   Occupational History  . clerk     Works  at [employer] History Main Topics  . Smoking status: Former Smoker    Types: Cigarettes  . Smokeless tobacco: Former Neurosurgeon    Quit date: 05/06/2012     Comment: Per pt she is tring to quit  . Alcohol Use: Not on file  . Drug Use: Not on file  . Sexually Active: Not on file   Other Topics Concern  . Not on file   Social History Narrative  . No narrative on file   Lives with stepmother and father.  Objective: BP 158/80  Pulse 88  Temp 98.6 F (37 C) (Oral)  Resp 18  SpO2 99%  EXAM: Gen: Well NAD, awake, not toxic appearing HEENT: MMM. [place]. AT. Missing all teeth. No LAD Lungs: CTABL, good effort Heart: RRR. 1/6 SM. ABD: NABS, NT, ND.  EXT: Right elbow erythema extending approximately 2 inches above and below medial malleolus. 2 distinct areas of oozing, probable bite marks/scratch marks, with small vesicles surrounding. Warm to touch. NEURO: Grossly intact. Good grip strength bilaterally with equal strength. Sensory intact   A/P: Holly Dunn is a 46 y.o. female with a one day history of redness, itchiness and swelling of her right elbow from a probable insect bite vs. Scratch mark from her stay in [facility] over the holidays. She has received prescription for doxycycline in the  clinic today, and 1 dose of vancomycin in the ED this evening for cellulitis. Swelling and redness could also be more of a histamine reaction, with possible beginnings of cellulitis.  - Recommended close follow up in the clinic tomorrow morning at 8:45, appointment has been made.  - Continue Doxycycline as prescribed.  - Use benadryl or vistaril to control itch and histamine reaction.   Felix Pacini, Do 08/16/12 11:45pm   PGY-2 addendum: I have seen and examined patient and I agree with Dr. Alan Ripper note above. I have made appropriate changes to reflect my findings. Discussed treatment options with patient and at this point she agrees with going home with close follow up in the morning. She  should continue her antibiotics as well as Benadryl and Tylenol for pain.   Briar Witherspoon M. Shey Yott, M.D. 08/17/2012 12:07 AM

## 2012-08-16 NOTE — Assessment & Plan Note (Signed)
Right arm cellulitis without signs of deep infection, or joint infection.  Will rx doxycycline bix x 7 days, discussed red flags for follow-up.

## 2012-08-16 NOTE — ED Notes (Signed)
Pt reports having been at her PCP today, had her arm marked and was given antibiotics.  Pt has cellulitis in her (R) elbow.  Pt is unsure of what the abrasions on her arm is from.  NAD.  Pt denies fever.

## 2012-08-16 NOTE — ED Provider Notes (Signed)
Angiocath insertion Performed by: Gracey Tolle  Poor IV access, multiple RN attempts. I was asked to placed peripheral IV using [place]  Consent: Verbal consent obtained. Risks and benefits: risks, benefits and alternatives were discussed Time out: Immediately prior to procedure a "time out" was called to verify the correct patient, procedure, equipment, support staff and site/side marked as required.  Preparation: Patient was prepped and draped in the usual sterile fashion.  Vein Location: L AC  Ultrasound Guided  Gauge: 20  Normal blood return and flush without difficulty Patient tolerance: Patient tolerated the procedure well with no immediate complications.    Sunnie Nielsen, MD 08/16/12 [phone]

## 2012-08-16 NOTE — Patient Instructions (Addendum)
Cellulitis  Cellulitis is an infection of the skin and the tissue under the skin. The infected area is usually red and tender. This happens most often in the arms and lower legs.  HOME CARE    Take your antibiotic medicine as told. Finish the medicine even if you start to feel better.   Keep the infected arm or leg raised (elevated).   Put a warm cloth on the area up to 4 times per day.   Only take medicines as told by your doctor.   Keep all doctor visits as told.  GET HELP RIGHT AWAY IF:    You have a fever.   You feel very sleepy.   You throw up (vomit) or have watery poop (diarrhea).   You feel sick and have muscle aches and pains.   You see red streaks on the skin coming from the infected area.   Your red area gets bigger or turns a dark color.   Your bone or joint under the infected area is painful after the skin heals.   Your infection comes back in the same area or different area.   You have a puffy (swollen) bump in the infected area.   You have new symptoms.  MAKE SURE YOU:    Understand these instructions.   Will watch your condition.   Will get help right away if you are not doing well or get worse.  Document Released: 01/25/2008 Document Revised: 02/07/2012 Document Reviewed: 10/24/2011  [employer] Patient Information 2013 [employer].

## 2012-08-16 NOTE — ED Notes (Signed)
Holly Dunn, dad- home [phone]

## 2012-08-16 NOTE — ED Provider Notes (Signed)
Holly Dunn is a 46 y.o. female was here for evaluation of right arm swelling. She feels like it has gotten worse since she was seen earlier today. She first noticed the bumps on her right medial elbow, yesterday; the redness and swelling has progressed since then.  Exam- right medial elbow with, redness, and swelling slightly beyond the mark drawn earlier today; more in the dependent then in the proximal region. Normal range of motion right elbow.  Patient is nontoxic in appearance  Nursing notes, applicable records and vitals reviewed.  Radiologic Images/Reports reviewed.   Results for orders placed during the hospital encounter of 08/16/12  CBC WITH DIFFERENTIAL      Component Value Range   WBC 8.3  4.0 - 10.5 K/uL   RBC 4.11  3.87 - 5.11 MIL/uL   Hemoglobin 13.7  12.0 - 15.0 g/dL   HCT 62.1  30.8 - 65.7 %   MCV 97.1  78.0 - 100.0 fL   MCH 33.3  26.0 - 34.0 pg   MCHC 34.3  30.0 - 36.0 g/dL   RDW 84.6  96.2 - 95.2 %   Platelets 305  150 - 400 K/uL   Neutrophils Relative 54  43 - 77 %   Neutro Abs 4.5  1.7 - 7.7 K/uL   Lymphocytes Relative 34  12 - 46 %   Lymphs Abs 2.8  0.7 - 4.0 K/uL   Monocytes Relative 6  3 - 12 %   Monocytes Absolute 0.5  0.1 - 1.0 K/uL   Eosinophils Relative 6 (*) 0 - 5 %   Eosinophils Absolute 0.5  0.0 - 0.7 K/uL   Basophils Relative 1  0 - 1 %   Basophils Absolute 0.1  0.0 - 0.1 K/uL  COMPREHENSIVE METABOLIC PANEL      Component Value Range   Sodium 138  135 - 145 mEq/L   Potassium 4.0  3.5 - 5.1 mEq/L   Chloride 101  96 - 112 mEq/L   CO2 27  19 - 32 mEq/L   Glucose, Bld 103 (*) 70 - 99 mg/dL   BUN 9  6 - 23 mg/dL   Creatinine, Ser 8.41  0.50 - 1.10 mg/dL   Calcium 9.8  8.4 - 32.4 mg/dL   Total Protein 7.5  6.0 - 8.3 g/dL   Albumin 4.2  3.5 - 5.2 g/dL   AST 19  0 - 37 U/L   ALT 26  0 - 35 U/L   Alkaline Phosphatase 88  39 - 117 U/L   Total Bilirubin 0.5  0.3 - 1.2 mg/dL   GFR calc non Af Amer 81 (*) >90 mL/min   GFR calc Af Amer >90  >90  mL/min     Medical screening examination/treatment/procedure(s) were conducted as a shared visit with non-physician practitioner(s) and myself.  I personally evaluated the patient during the encounter  Flint Melter, MD 08/17/12 1038

## 2012-08-17 ENCOUNTER — Ambulatory Visit (INDEPENDENT_AMBULATORY_CARE_PROVIDER_SITE_OTHER): Payer: Self-pay | Admitting: Family Medicine

## 2012-08-17 ENCOUNTER — Encounter: Payer: Self-pay | Admitting: Family Medicine

## 2012-08-17 VITALS — BP 117/77 | HR 83 | Temp 97.8°F | Ht 67.0 in | Wt 239.0 lb

## 2012-08-17 DIAGNOSIS — L0390 Cellulitis, unspecified: Secondary | ICD-10-CM

## 2012-08-17 NOTE — Patient Instructions (Addendum)
I am sorry you got cellulitis on Christmas.  Please keep taking your Doxycycline twice a day every day.  If you think it is getting worse, or you have high fevers, chills, please call the office for a visit.  I will check on you next Friday as long as you think it is continuing to get better.

## 2012-08-17 NOTE — Progress Notes (Signed)
  Subjective:    Patient ID: Holly Dunn, female    DOB: 12/23/1965, 46 y.o.   MRN: [id number]  HPI  Holly Dunn comes in for follow up of cellulitis on her right elbow.  She was seen by Dr. Earnest Dunn in clinic yesterday, and lines were drawn around the area of redness.  She was prescribed doxycycline.  However, later that day she went to the ER because the area of redness spread about an inch beyond the lines.  She got a dose of Vancomycin in the ER, and was discharged home with close follow up.  She got her doxycycline filled, and took the first dose this morning.  She took benadryl last night, which helped with the itching.  She has not had fevers, chills, and says she thinks the redness looks much better.   Review of Systems See HPI    Objective:   Physical Exam BP 117/77  Pulse 83  Temp 97.8 F (36.6 C) (Oral)  Ht 5\' 7"  (1.702 m)  Wt 239 lb (108.41 kg)  BMI 37.43 kg/m2 General appearance: alert, cooperative and no distress Skin: There is mild erythema (pinkness) on medial side of R elbow, there is minimal warmth and 2 areas with scabs, but no fluctuance or induration.  The area of pinkness goes about 1 inch wider than the lines of skin marker that are still visible. Patient has normal ROM of elbow joint, no tenderness of joint, and 2+ radial pulses.        Assessment & Plan:

## 2012-08-17 NOTE — Assessment & Plan Note (Signed)
Improving, no systemic signs or signs of joint infection.  Continue Doxycycline, benadryl for itching, and reviewed signs to return to care urgently.  F/U appointment scheduled with me in one week.

## 2012-08-24 ENCOUNTER — Ambulatory Visit (INDEPENDENT_AMBULATORY_CARE_PROVIDER_SITE_OTHER): Payer: Self-pay | Admitting: Family Medicine

## 2012-08-24 ENCOUNTER — Encounter: Payer: Self-pay | Admitting: Family Medicine

## 2012-08-24 VITALS — BP 127/82 | HR 90 | Temp 98.2°F | Ht 67.0 in | Wt 238.9 lb

## 2012-08-24 DIAGNOSIS — L0390 Cellulitis, unspecified: Secondary | ICD-10-CM

## 2012-08-24 DIAGNOSIS — J069 Acute upper respiratory infection, unspecified: Secondary | ICD-10-CM | POA: Insufficient documentation

## 2012-08-24 DIAGNOSIS — L0291 Cutaneous abscess, unspecified: Secondary | ICD-10-CM

## 2012-08-24 NOTE — Assessment & Plan Note (Signed)
Cough and nasal congestion- no sign of bacterial infection.  Discussed symptomatic care.

## 2012-08-24 NOTE — Progress Notes (Signed)
  Subjective:    Patient ID: Holly Dunn, female    DOB: 1966/05/28, 47 y.o.   MRN: [id number]  HPI  Holly Dunn comes in for follow up of her right elbow cellulitis.  She finished her antibiotics this morning.  She says it is almost all the way better.  It is still a little tender to the touch.   She also complains of nasal congestion and cough. This has been going on a few days.   Review of Systems No fevers, chills, sputum production, dyspnea, chest pain, extremity swelling.     Objective:   Physical Exam BP 127/82  Pulse 90  Temp 98.2 F (36.8 C) (Oral)  Ht 5\' 7"  (1.702 m)  Wt 238 lb 14.4 oz (108.364 kg)  BMI 37.42 kg/m2 Nose: clear discharge Throat: lips, mucosa, and tongue normal; teeth and gums normal Neck: no adenopathy and supple, symmetrical, trachea midline Lungs: clear to auscultation bilaterally Heart: regular rate and rhythm, S1, S2 normal, no murmur, click, rub or gallop Elbow: There is a small area (1cm diameter) of scaling skin, but no longer any erythema, induration, or warmth.         Assessment & Plan:

## 2012-08-24 NOTE — Patient Instructions (Signed)
I am glad your elbow is so much better.  Please call the office for an appointment if it gets red, swollen, painful, or warm again.   For your cold- please drink lots of fluids, take robitussin, and get plenty of rest.

## 2012-08-24 NOTE — Assessment & Plan Note (Signed)
Improved, pt has completed antibiotics, does not appear to need extended course.  Reviewed red flags to return to care if symptoms return.

## 2012-09-03 ENCOUNTER — Ambulatory Visit
Admission: RE | Admit: 2012-09-03 | Discharge: 2012-09-03 | Disposition: A | Discharge status: Home / Self Care | Payer: Self-pay | Source: Ambulatory Visit | Attending: Family Medicine | Admitting: Family Medicine

## 2012-09-03 DIAGNOSIS — Z1231 Encounter for screening mammogram for malignant neoplasm of breast: Secondary | ICD-10-CM

## 2012-09-11 ENCOUNTER — Ambulatory Visit (HOSPITAL_COMMUNITY)
Admission: RE | Admit: 2012-09-11 | Discharge: 2012-09-11 | Disposition: A | Discharge status: Home / Self Care | Payer: Self-pay | Source: Ambulatory Visit | Attending: Family Medicine | Admitting: Family Medicine

## 2012-09-11 DIAGNOSIS — Z1231 Encounter for screening mammogram for malignant neoplasm of breast: Secondary | ICD-10-CM | POA: Insufficient documentation

## 2012-09-13 ENCOUNTER — Other Ambulatory Visit: Payer: Self-pay | Admitting: Family Medicine

## 2012-09-13 DIAGNOSIS — Z1231 Encounter for screening mammogram for malignant neoplasm of breast: Secondary | ICD-10-CM

## 2012-09-14 ENCOUNTER — Encounter: Payer: Self-pay | Admitting: Family Medicine

## 2012-09-20 ENCOUNTER — Other Ambulatory Visit: Payer: Self-pay | Admitting: Family Medicine

## 2012-09-20 DIAGNOSIS — R928 Other abnormal and inconclusive findings on diagnostic imaging of breast: Secondary | ICD-10-CM

## 2012-10-09 ENCOUNTER — Encounter: Payer: Self-pay | Admitting: Family Medicine

## 2012-10-09 ENCOUNTER — Ambulatory Visit (INDEPENDENT_AMBULATORY_CARE_PROVIDER_SITE_OTHER): Payer: Self-pay | Admitting: Family Medicine

## 2012-10-09 VITALS — BP 143/87 | HR 94 | Temp 99.2°F | Ht 67.0 in | Wt 240.0 lb

## 2012-10-09 DIAGNOSIS — R222 Localized swelling, mass and lump, trunk: Secondary | ICD-10-CM

## 2012-10-09 DIAGNOSIS — R229 Localized swelling, mass and lump, unspecified: Secondary | ICD-10-CM

## 2012-10-09 MED ORDER — DOXYCYCLINE HYCLATE 100 MG PO TABS: 100.0000 mg | ORAL_TABLET | Freq: Two times a day (BID) | ORAL | Status: DC

## 2012-10-09 NOTE — Assessment & Plan Note (Signed)
Concern for infection vs possible malignancy.  Pt scheduled to have diagnostic mammogram in the next two weeks for mass found on screening mammogram.  Will tx for infection with Doxy 100 mg BID x 7 days.  Also will get US of the area at the breast center for further evaluation.  Will see back in one to two weeks.

## 2012-10-09 NOTE — Addendum Note (Signed)
Addended by: Briscoe Deutscher on: 10/09/2012 03:14 PM   Modules accepted: Orders

## 2012-10-09 NOTE — Progress Notes (Addendum)
Holly Dunn is a 47 y.o. female who presents today for worsening inflammation and cyst on her left medial breast wall.  This has been ongoing for several months now and saw Dr. Clinton Sawyer for a similar complaint about two months ago.  At that time, she was scheduled to have a mammogram which ended up showing a possible mass.  She is scheduled to go back for diagnostic mammogram on 10/29/12.  Over the last three days, she has noticed the area has started to become painful, erythematous, and doubled in size.  No fever, chills, nipple discharge, no family history of breast cancer, and does not currently smoke.    Past Medical History  Diagnosis Date  . Benign essential HTN 02-03-12  . Tobacco abuse 01/26/2012    2ppd from 1998-2008. Stopped 2008-2011. Restarted after death of husband. As of 02-03-2012, smoking 1ppd. Approximately 25 pack year history. Would like to quit.   . Coronary artery disease, non-occlusive 01/26/2012    Cardiac cath in 2009 performed by Dr. Virgel Manifold in [place], [place]. 40% stenosis of LAD proximal to 2nd diagonal. EF 55%  . Right hand fracture 2011  . Closed right ankle fracture 1979  . Hyperlipidemia LDL goal < 100 02/17/2012  . Migraine 05-10-12    History  Smoking status  . Former Smoker  . Types: Cigarettes  Smokeless tobacco  . Former Neurosurgeon  . Quit date: 05/06/2012    Comment: Per pt she is tring to quit    Family History  Problem Relation Age of Onset  . Heart disease Mother 38    First MI  . Hypertension Mother   . Kidney disease Mother   . Hyperlipidemia Mother     Current Outpatient Prescriptions on File Prior to Visit  Medication Sig Dispense Refill  . aspirin 81 MG EC tablet Take 1 tablet (81 mg total) by mouth daily. Swallow whole.  30 tablet  12  . atorvastatin (LIPITOR) 40 MG tablet Take 1 tablet (40 mg total) by mouth daily.  90 tablet  3  . Fish Oil-Cholecalciferol (FISH OIL/D3 ADULT GUMMIES) 184-250 MG-UNIT CHEW Chew 2 capsules by mouth daily.       Marland Kitchen lisinopril (PRINIVIL,ZESTRIL) 20 MG tablet Take 1 tablet (20 mg total) by mouth daily.  60 tablet  0  . Multiple Vitamin (MULTIVITAMIN) tablet Take 1 tablet by mouth daily.       No current facility-administered medications on file prior to visit.    ROS: Per HPI.  All other systems reviewed and are negative.   Physical Exam Filed Vitals:   10/09/12 1354  BP: 143/87  Pulse: 94  Temp: 99.2 F (37.3 C)    Physical Examination: General appearance - alert, well appearing, and in no distress Neck - supple, no significant adenopathy Lymphatics - no palpable lymphadenopathy Chest - clear to auscultation, no wheezes, rales or rhonchi, symmetric air entry Heart - normal rate, regular rhythm, normal S1, S2, +1/6 SEM rubs, clicks or gallops Abdomen - soft, nontender, nondistended, no masses or organomegaly MSK: TTP manubrium.  Skin: 1 x 2 cm erythematous rash left medial breast.  + cystic component below.  No induration or dimpling of the skin

## 2012-10-09 NOTE — Patient Instructions (Signed)
Holly Dunn, it was nice meeting you today.  We are going to treat you with antibiotics for a possible skin infection.  Also we will have you go to the breast center to have an ultrasound of the area.  We will see you back in two weeks.  Thanks, Dr. Paulina Fusi   Cellulitis Cellulitis is an infection of the skin and the tissue beneath it. The infected area is usually red and tender. Cellulitis occurs most often in the arms and lower legs.  CAUSES  Cellulitis is caused by bacteria that enter the skin through cracks or cuts in the skin. The most common types of bacteria that cause cellulitis are Staphylococcus and Streptococcus. SYMPTOMS   Redness and warmth.  Swelling.  Tenderness or pain.  Fever. DIAGNOSIS  Your caregiver can usually determine what is wrong based on a physical exam. Blood tests may also be done. TREATMENT  Treatment usually involves taking an antibiotic medicine. HOME CARE INSTRUCTIONS   Take your antibiotics as directed. Finish them even if you start to feel better.  Keep the infected arm or leg elevated to reduce swelling.  Apply a warm cloth to the affected area up to 4 times per day to relieve pain.  Only take over-the-counter or prescription medicines for pain, discomfort, or fever as directed by your caregiver.  Keep all follow-up appointments as directed by your caregiver. SEEK MEDICAL CARE IF:   You notice red streaks coming from the infected area.  Your red area gets larger or turns dark in color.  Your bone or joint underneath the infected area becomes painful after the skin has healed.  Your infection returns in the same area or another area.  You notice a swollen bump in the infected area.  You develop new symptoms. SEEK IMMEDIATE MEDICAL CARE IF:   You have a fever.  You feel very sleepy.  You develop vomiting or diarrhea.  You have a general ill feeling (malaise) with muscle aches and pains. MAKE SURE YOU:   Understand these  instructions.  Will watch your condition.  Will get help right away if you are not doing well or get worse. Document Released: 05/18/2005 Document Revised: 02/07/2012 Document Reviewed: 10/24/2011 [facility] Patient Information 2013 [place], [place].

## 2012-10-11 ENCOUNTER — Telehealth (HOSPITAL_COMMUNITY): Payer: Self-pay | Admitting: *Deleted

## 2012-10-11 NOTE — Telephone Encounter (Signed)
Telephoned patient at home # and left message to return call to [facility] 

## 2012-10-15 ENCOUNTER — Encounter (HOSPITAL_COMMUNITY): Payer: Self-pay | Admitting: *Deleted

## 2012-10-19 ENCOUNTER — Ambulatory Visit (INDEPENDENT_AMBULATORY_CARE_PROVIDER_SITE_OTHER): Payer: Self-pay | Admitting: Family Medicine

## 2012-10-19 ENCOUNTER — Encounter: Payer: Self-pay | Admitting: Family Medicine

## 2012-10-19 VITALS — BP 147/88 | HR 85 | Temp 98.7°F | Ht 67.0 in | Wt 241.0 lb

## 2012-10-19 DIAGNOSIS — R222 Localized swelling, mass and lump, trunk: Secondary | ICD-10-CM

## 2012-10-19 DIAGNOSIS — R229 Localized swelling, mass and lump, unspecified: Secondary | ICD-10-CM

## 2012-10-19 MED ORDER — DOXYCYCLINE HYCLATE 100 MG PO TABS: 100.0000 mg | ORAL_TABLET | Freq: Two times a day (BID) | ORAL | Status: DC

## 2012-10-19 NOTE — Assessment & Plan Note (Signed)
I and D with sebum extracted.  Left open to close by secondary intent.  Reorder doxy for another week.

## 2012-10-19 NOTE — Patient Instructions (Addendum)
You had an infected sebaceous cyst, which I drained.  Keep the incision clean and triple antibiotic oint on it.   I sent in more antibiotics for you

## 2012-10-19 NOTE — Progress Notes (Signed)
  Subjective:    Patient ID: Holly Dunn, female    DOB: [birth year], 47 y.o.   MRN: [id number]  HPI  FU infected area of midline of left breast.  Not any improvement with doxy.    Review of Systems     Objective:   Physical Exam  Informed consent obtained.  Xylocaine with epi anesthesia.  I&D done.  Surprisingly, this was not a simple abscess.  Rather, it was an infected sebaceous cyst with considerable sebum extruded.  Patient tolerated procedure well.  1 CM incision left open.        Assessment & Plan:

## 2012-10-23 ENCOUNTER — Encounter (HOSPITAL_COMMUNITY): Payer: Self-pay

## 2012-10-23 ENCOUNTER — Ambulatory Visit (HOSPITAL_COMMUNITY)
Admission: RE | Admit: 2012-10-23 | Discharge: 2012-10-23 | Disposition: A | Discharge status: Home / Self Care | Payer: Self-pay | Source: Ambulatory Visit | Attending: Obstetrics and Gynecology | Admitting: Obstetrics and Gynecology

## 2012-10-23 VITALS — BP 150/84 | Temp 98.0°F | Ht 67.0 in | Wt 243.0 lb

## 2012-10-23 DIAGNOSIS — Z01419 Encounter for gynecological examination (general) (routine) without abnormal findings: Secondary | ICD-10-CM

## 2012-10-23 NOTE — Progress Notes (Signed)
Patient referred to the [facility] due to needing additional imaging of the left breast. Screening mammogram completed 09/13/2012 at [facility].  Pap Smear:    Pap smear completed today. Per patient last Pap smear was 3 years ago and normal. Per patient no history of abnormal Pap smears. No Pap smear results in EPIC.  Physical exam: Breasts Right breast larger than left breast.  Gauze dressing between breasts where patient states she had a sebaceous cyst lanced. Dressing was clean, dry and intact. No nipple retraction bilateral breasts. No nipple discharge bilateral breasts. No lymphadenopathy. No lumps palpated bilateral breasts. Referred patient to the [facility] for left breast diagnostic mammogram and ultrasound per recommendation. Appointment scheduled for Monday, October 29, 2012 at 1510.       Pelvic/Bimanual   Ext Genitalia No lesions, no swelling and no discharge observed on external genitalia.         Vagina Vagina pink and normal texture. No lesions or discharge observed in vagina.          Cervix Cervix is present. Cervix pink and of normal texture. Small amount of mucous discharge observed on cervix.     Uterus Uterus is present and palpable. Uterus in normal position and normal size.        Adnexae Bilateral ovaries present and palpable. No tenderness on palpation.          Rectovaginal No rectal exam completed today since patient had no rectal complaints. No skin abnormalities observed on exam.

## 2012-10-23 NOTE — Patient Instructions (Signed)
Taught patient how to perform BSE. Let her know [facility] will cover Pap smears every 3 years unless has a history of abnormal Pap smears. Referred patient to the [facility] for left breast diagnostic mammogram and ultrasound per recommendation. Appointment scheduled for Monday, October 29, 2012 at 1510. Patient aware of appointment and will be there. Let patient know will follow up with her within the next couple weeks with results by letter or phone. Patient verbalized understanding.

## 2012-10-24 ENCOUNTER — Other Ambulatory Visit: Payer: Self-pay | Admitting: *Deleted

## 2012-10-24 DIAGNOSIS — A599 Trichomoniasis, unspecified: Secondary | ICD-10-CM

## 2012-10-24 MED ORDER — METRONIDAZOLE 500 MG PO TABS: 500.0000 mg | ORAL_TABLET | Freq: Two times a day (BID) | ORAL | Status: DC

## 2012-10-29 ENCOUNTER — Ambulatory Visit
Admission: RE | Admit: 2012-10-29 | Discharge: 2012-10-29 | Disposition: A | Discharge status: Home / Self Care | Payer: No Typology Code available for payment source | Source: Ambulatory Visit | Attending: Family Medicine | Admitting: Family Medicine

## 2012-10-29 DIAGNOSIS — R928 Other abnormal and inconclusive findings on diagnostic imaging of breast: Secondary | ICD-10-CM

## 2012-10-31 ENCOUNTER — Ambulatory Visit: Payer: Self-pay | Admitting: Family Medicine

## 2012-12-20 ENCOUNTER — Ambulatory Visit (INDEPENDENT_AMBULATORY_CARE_PROVIDER_SITE_OTHER): Payer: BC Managed Care – PPO | Admitting: Family Medicine

## 2012-12-20 ENCOUNTER — Encounter: Payer: Self-pay | Admitting: Family Medicine

## 2012-12-20 VITALS — BP 117/79 | HR 90 | Ht 67.0 in | Wt 238.6 lb

## 2012-12-20 DIAGNOSIS — M25519 Pain in unspecified shoulder: Secondary | ICD-10-CM

## 2012-12-20 DIAGNOSIS — M25512 Pain in left shoulder: Secondary | ICD-10-CM

## 2012-12-20 MED ORDER — DICLOFENAC SODIUM 75 MG PO TBEC: 75.0000 mg | DELAYED_RELEASE_TABLET | Freq: Two times a day (BID) | ORAL | Status: DC

## 2012-12-20 NOTE — Patient Instructions (Signed)
Shoulder Exercises EXERCISES  RANGE OF MOTION (ROM) AND STRETCHING EXERCISES These exercises may help you when beginning to rehabilitate your injury. Your symptoms may resolve with or without further involvement from your physician, physical therapist or athletic trainer. While completing these exercises, remember:   Restoring tissue flexibility helps normal motion to return to the joints. This allows healthier, less painful movement and activity.  An effective stretch should be held for at least 30 seconds.  A stretch should never be painful. You should only feel a gentle lengthening or release in the stretched tissue. ROM - Pendulum  Bend at the waist so that your right / left arm falls away from your body. Support yourself with your opposite hand on a solid surface, such as a table or a countertop.  Your right / left arm should be perpendicular to the ground. If it is not perpendicular, you need to lean over farther. Relax the muscles in your right / left arm and shoulder as much as possible.  Gently sway your hips and trunk so they move your right / left arm without any use of your right / left shoulder muscles.  Progress your movements so that your right / left arm moves side to side, then forward and backward, and finally, both clockwise and counterclockwise.  Complete __________ repetitions in each direction. Many people use this exercise to relieve discomfort in their shoulder as well as to gain range of motion. Repeat __________ times. Complete this exercise __________ times per day. STRETCH  Flexion, Standing  Stand with good posture. With an underhand grip on your right / left hand and an overhand grip on the opposite hand, grasp a broomstick or cane so that your hands are a little more than shoulder-width apart.  Keeping your right / left elbow straight and shoulder muscles relaxed, push the stick with your opposite hand to raise your right / left arm in front of your body and  then overhead. Raise your arm until you feel a stretch in your right / left shoulder, but before you have increased shoulder pain.  Try to avoid shrugging your right / left shoulder as your arm rises by keeping your shoulder blade tucked down and toward your mid-back spine. Hold __________ seconds.  Slowly return to the starting position. Repeat __________ times. Complete this exercise __________ times per day. STRETCH - Internal Rotation  Place your right / left hand behind your back, palm-up.  Throw a towel or belt over your opposite shoulder. Grasp the towel/belt with your right / left hand.  While keeping an upright posture, gently pull up on the towel/belt until you feel a stretch in the front of your right / left shoulder.  Avoid shrugging your right / left shoulder as your arm rises by keeping your shoulder blade tucked down and toward your mid-back spine.  Hold __________. Release the stretch by lowering your opposite hand. Repeat __________ times. Complete this exercise __________ times per day. STRETCH - External Rotation and Abduction  Stagger your stance through a doorframe. It does not matter which foot is forward.  As instructed by your physician, physical therapist or athletic trainer, place your hands:  And forearms above your head and on the door frame.  And forearms at head-height and on the door frame.  At elbow-height and on the door frame.  Keeping your head and chest upright and your stomach muscles tight to prevent over-extending your low-back, slowly shift your weight onto your front foot until you feel   a stretch across your chest and/or in the front of your shoulders.  Hold __________ seconds. Shift your weight to your back foot to release the stretch. Repeat __________ times. Complete this stretch __________ times per day.  STRENGTHENING EXERCISES  These exercises may help you when beginning to rehabilitate your injury. They may resolve your symptoms with  or without further involvement from your physician, physical therapist or athletic trainer. While completing these exercises, remember:   Muscles can gain both the endurance and the strength needed for everyday activities through controlled exercises.  Complete these exercises as instructed by your physician, physical therapist or athletic trainer. Progress the resistance and repetitions only as guided.  You may experience muscle soreness or fatigue, but the pain or discomfort you are trying to eliminate should never worsen during these exercises. If this pain does worsen, stop and make certain you are following the directions exactly. If the pain is still present after adjustments, discontinue the exercise until you can discuss the trouble with your clinician.  If advised by your physician, during your recovery, avoid activity or exercises which involve actions that place your right / left hand or elbow above your head or behind your back or head. These positions stress the tissues which are trying to heal. STRENGTH - Scapular Depression and Adduction  With good posture, sit on a firm chair. Supported your arms in front of you with pillows, arm rests or a table top. Have your elbows in line with the sides of your body.  Gently draw your shoulder blades down and toward your mid-back spine. Gradually increase the tension without tensing the muscles along the top of your shoulders and the back of your neck.  Hold for __________ seconds. Slowly release the tension and relax your muscles completely before completing the next repetition.  After you have practiced this exercise, remove the arm support and complete it in standing as well as sitting. Repeat __________ times. Complete this exercise __________ times per day.  STRENGTH - External Rotators  Secure a rubber exercise band/tubing to a fixed object so that it is at the same height as your right / left elbow when you are standing or sitting on a  firm surface.  Stand or sit so that the secured exercise band/tubing is at your side that is not injured.  Bend your elbow 90 degrees. Place a folded towel or small pillow under your right / left arm so that your elbow is a few inches away from your side.  Keeping the tension on the exercise band/tubing, pull it away from your body, as if pivoting on your elbow. Be sure to keep your body steady so that the movement is only coming from your shoulder rotating.  Hold __________ seconds. Release the tension in a controlled manner as you return to the starting position. Repeat __________ times. Complete this exercise __________ times per day.  STRENGTH - Supraspinatus  Stand or sit with good posture. Grasp a __________ weight or an exercise band/tubing so that your hand is "thumbs-up," like when you shake hands.  Slowly lift your right / left hand from your thigh into the air, traveling about 30 degrees from straight out at your side. Lift your hand to shoulder height or as far as you can without increasing any shoulder pain. Initially, many people do not lift their hands above shoulder height.  Avoid shrugging your right / left shoulder as your arm rises by keeping your shoulder blade tucked down and toward your   mid-back spine.  Hold for __________ seconds. Control the descent of your hand as you slowly return to your starting position. Repeat __________ times. Complete this exercise __________ times per day.  STRENGTH - Shoulder Extensors  Secure a rubber exercise band/tubing so that it is at the height of your shoulders when you are either standing or sitting on a firm arm-less chair.  With a thumbs-up grip, grasp an end of the band/tubing in each hand. Straighten your elbows and lift your hands straight in front of you at shoulder height. Step back away from the secured end of band/tubing until it becomes tense.  Squeezing your shoulder blades together, pull your hands down to the sides of  your thighs. Do not allow your hands to go behind you.  Hold for __________ seconds. Slowly ease the tension on the band/tubing as you reverse the directions and return to the starting position. Repeat __________ times. Complete this exercise __________ times per day.  STRENGTH - Scapular Retractors  Secure a rubber exercise band/tubing so that it is at the height of your shoulders when you are either standing or sitting on a firm arm-less chair.  With a palm-down grip, grasp an end of the band/tubing in each hand. Straighten your elbows and lift your hands straight in front of you at shoulder height. Step back away from the secured end of band/tubing until it becomes tense.  Squeezing your shoulder blades together, draw your elbows back as you bend them. Keep your upper arm lifted away from your body throughout the exercise.  Hold __________ seconds. Slowly ease the tension on the band/tubing as you reverse the directions and return to the starting position. Repeat __________ times. Complete this exercise __________ times per day. STRENGTH  Scapular Depressors  Find a sturdy chair without wheels, such as a from a dining room table.  Keeping your feet on the floor, lift your bottom from the seat and lock your elbows.  Keeping your elbows straight, allow gravity to pull your body weight down. Your shoulders will rise toward your ears.  Raise your body against gravity by drawing your shoulder blades down your back, shortening the distance between your shoulders and ears. Although your feet should always maintain contact with the floor, your feet should progressively support less body weight as you get stronger.  Hold __________ seconds. In a controlled and slow manner, lower your body weight to begin the next repetition. Repeat __________ times. Complete this exercise __________ times per day.  Document Released: 06/22/2005 Document Revised: 10/31/2011 Document Reviewed: 11/20/2008 [facility]  Patient Information 2013 [place], [place]. Shoulder Pain The shoulder is the joint that connects your arms to your body. The bones that form the shoulder joint include the upper arm bone (humerus), the shoulder blade (scapula), and the collarbone (clavicle). The top of the humerus is shaped like a ball and fits into a rather flat socket on the scapula (glenoid cavity). A combination of muscles and strong, fibrous tissues that connect muscles to bones (tendons) support your shoulder joint and hold the ball in the socket. Small, fluid-filled sacs (bursae) are located in different areas of the joint. They act as cushions between the bones and the overlying soft tissues and help reduce friction between the gliding tendons and the bone as you move your arm. Your shoulder joint allows a wide range of motion in your arm. This range of motion allows you to do things like scratch your back or throw a ball. However, this range of motion  also makes your shoulder more prone to pain from overuse and injury. Causes of shoulder pain can originate from both injury and overuse and usually can be grouped in the following four categories:  Redness, swelling, and pain (inflammation) of the tendon (tendinitis) or the bursae (bursitis).  Instability, such as a dislocation of the joint.  Inflammation of the joint (arthritis).  Broken bone (fracture). HOME CARE INSTRUCTIONS   Apply ice to the sore area.  Put ice in a plastic bag.  Place a towel between your skin and the bag.  Leave the ice on for 15 to 20 minutes, 3 to 4 times per day for the first 2 days.  If you have a shoulder sling or immobilizer, wear it as long as your caregiver instructs. Only remove it to shower or bathe. Move your arm as little as possible, but keep your hand moving to prevent swelling.  Only take over-the-counter or prescription medicines for pain, discomfort, or fever as directed by your caregiver. SEEK MEDICAL CARE IF:   Your shoulder  pain increases, or new pain develops in your arm, hand, or fingers.  Your hand or fingers become cold and numb.  Your pain is not relieved with medicines. SEEK IMMEDIATE MEDICAL CARE IF:   Your arm, hand, or fingers are numb or tingling.  Your arm, hand, or fingers are significantly swollen or turn white or blue. MAKE SURE YOU:   Understand these instructions.  Will watch your condition.  Will get help right away if you are not doing well or get worse. Document Released: 05/18/2005 Document Revised: 10/31/2011 Document Reviewed: 07/23/2011 [facility] Patient Information 2013 [place], [place].

## 2012-12-20 NOTE — Assessment & Plan Note (Signed)
If no improvement with conservative measures consider PT.

## 2012-12-22 NOTE — Progress Notes (Signed)
  Subjective:    Patient ID: Holly Dunn, female    DOB: June 07, 1966, 47 y.o.   MRN: [id number]  HPI Reports 2 day history of left shoulder pain.  Notes tingling in the arm and hand.  Worse with movement.  It may radiate to chest.  Pt. Has h/o Htn, obesity, elevated chol. And non-occlusive CAD (cath in 2009 w/ 40% stenosis of LAD).  Has had similar episode of this earlier last year.  Works in [employer] and has repetitive overhead motion at work.  Also, initial injury with making donuts.   Review of Systems  Constitutional: Negative for fever, chills and diaphoresis.  Respiratory: Negative for shortness of breath.   Cardiovascular: Negative for leg swelling.  Gastrointestinal: Negative for nausea and vomiting.       Objective:   Physical Exam  Vitals reviewed. Constitutional: She appears well-developed and well-nourished. No distress.  HENT:  Head: Normocephalic and atraumatic.  Eyes: No scleral icterus.  Neck: Neck supple. No thyromegaly present.  Cardiovascular: Normal rate and regular rhythm.   No murmur heard. Pulmonary/Chest: Effort normal and breath sounds normal. No respiratory distress.  Musculoskeletal:       Left shoulder: She exhibits tenderness. She exhibits no bony tenderness and no swelling.  Decreased ROM, with overhead reaching.  Rotator cuff is tender.          Assessment & Plan:

## 2013-02-04 ENCOUNTER — Other Ambulatory Visit: Payer: Self-pay | Admitting: Family Medicine

## 2013-02-04 DIAGNOSIS — M25512 Pain in left shoulder: Secondary | ICD-10-CM

## 2013-02-11 ENCOUNTER — Ambulatory Visit
Admission: RE | Admit: 2013-02-11 | Discharge: 2013-02-11 | Disposition: A | Discharge status: Home / Self Care | Payer: BC Managed Care – PPO | Source: Ambulatory Visit | Attending: Family Medicine | Admitting: Family Medicine

## 2013-02-11 DIAGNOSIS — M25512 Pain in left shoulder: Secondary | ICD-10-CM

## 2013-02-14 ENCOUNTER — Ambulatory Visit
Admission: RE | Admit: 2013-02-14 | Discharge: 2013-02-14 | Disposition: A | Discharge status: Home / Self Care | Payer: BC Managed Care – PPO | Source: Ambulatory Visit | Attending: Family Medicine | Admitting: Family Medicine

## 2013-02-21 ENCOUNTER — Telehealth: Payer: Self-pay

## 2013-02-21 ENCOUNTER — Encounter: Payer: Self-pay | Admitting: Family Medicine

## 2013-02-21 ENCOUNTER — Ambulatory Visit (INDEPENDENT_AMBULATORY_CARE_PROVIDER_SITE_OTHER): Payer: BC Managed Care – PPO | Admitting: Family Medicine

## 2013-02-21 VITALS — BP 114/78 | HR 97 | Temp 98.5°F | Ht 67.0 in | Wt 238.0 lb

## 2013-02-21 DIAGNOSIS — B029 Zoster without complications: Secondary | ICD-10-CM

## 2013-02-21 DIAGNOSIS — R21 Rash and other nonspecific skin eruption: Secondary | ICD-10-CM

## 2013-02-21 MED ORDER — HYDROXYZINE HCL 10 MG PO TABS: 10.0000 mg | ORAL_TABLET | Freq: Three times a day (TID) | ORAL | Status: DC | PRN

## 2013-02-21 MED ORDER — CEPHALEXIN 500 MG PO CAPS: 500.0000 mg | ORAL_CAPSULE | Freq: Four times a day (QID) | ORAL | Status: DC

## 2013-02-21 MED ORDER — VALACYCLOVIR HCL 1 G PO TABS: 1000.0000 mg | ORAL_TABLET | Freq: Three times a day (TID) | ORAL | Status: DC

## 2013-02-21 NOTE — Progress Notes (Signed)
  Subjective:    Patient ID: Holly Dunn, female    DOB: 04/30/1966, 47 y.o.   MRN: [id number]  HPI  47 year old F who presents with a rash  Rash Location: left superior and anterior shoulder Characteristics: scattered redness with bump Duration: 2 days, worsening itching Onset: quickly Associated Symptoms: itching and tenderness Relieved By: nothing (tried benadryl cream) Exacerbated by: touching, moving left arm and shoulder  Review of Systems     Objective:   Physical Exam BP 114/78  Pulse 97  Temp(Src) 98.5 F (36.9 C) (Oral)  Ht 5\' 7"  (1.702 m)  Wt 238 lb (107.956 kg)  BMI 37.27 kg/m2 Gen: middle age WF, obese, non ill appearing, pleasant and conversant  Rash - located on left superior shoulder, and down anterior left chest and stop abruptly at sternum; papular lesions without crusting on shoulder that leads to confluence erythema on left chest     Assessment & Plan:

## 2013-02-21 NOTE — Patient Instructions (Addendum)
Dear Ms. Pinson,   Please start with valacyclovir 3 x a day for 7 days. Take the atarax for itching and any pain reliever as needed.   Also, start the antibiotics on Sunday or sooner if the redness is spreading.   Please follow early next week.   Sincerely,   Dr. Clinton Sawyer

## 2013-02-21 NOTE — Telephone Encounter (Signed)
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

## 2013-02-22 DIAGNOSIS — R21 Rash and other nonspecific skin eruption: Secondary | ICD-10-CM | POA: Insufficient documentation

## 2013-02-22 NOTE — Assessment & Plan Note (Addendum)
Red maculo-papaular rash with tenderness and itching - DDX is contact dermatitis vs shingles vs insect bites +/- cellulitis. Given the concern for possible shingles, although not a true dermatomal distribution, it is wortwhile to treat to try and prevent neuralgia and the treatment is not harmful. Start valacyclovir 1 g TID x 7 days. Also given rx for Keflex in case redness and pain worsens to treat potential cellulitis. If contact allergy, patient will take anti-histamine F/u in 1 week.

## 2013-02-24 ENCOUNTER — Other Ambulatory Visit: Payer: Self-pay | Admitting: Family Medicine

## 2013-02-24 NOTE — Progress Notes (Signed)
Patient needs note for work and an appointment with neurosurgeon before her scheduled appt at the end of this month.

## 2013-03-11 ENCOUNTER — Telehealth: Payer: Self-pay | Admitting: Family Medicine

## 2013-03-11 DIAGNOSIS — I10 Essential (primary) hypertension: Secondary | ICD-10-CM

## 2013-03-11 MED ORDER — LISINOPRIL 20 MG PO TABS: 20.0000 mg | ORAL_TABLET | Freq: Every day | ORAL | Status: DC

## 2013-03-11 NOTE — Telephone Encounter (Signed)
Pt is requesting a refill on Lisinopril be sent to [facility] on [address]. JW

## 2013-03-11 NOTE — Telephone Encounter (Signed)
Will fwd to MD for review.  Hurshel Bouillon L, CMA  

## 2013-03-11 NOTE — Telephone Encounter (Signed)
Prescription sent to pharmacy.

## 2013-04-12 ENCOUNTER — Telehealth: Payer: Self-pay | Admitting: Family Medicine

## 2013-04-12 DIAGNOSIS — E785 Hyperlipidemia, unspecified: Secondary | ICD-10-CM

## 2013-04-12 NOTE — Telephone Encounter (Signed)
Pt is calling to check the status of her request for a refill on lipitor. She said that [facility] faxed [place] a request 2 days ago.JW

## 2013-04-15 MED ORDER — ATORVASTATIN CALCIUM 40 MG PO TABS: 40.0000 mg | ORAL_TABLET | Freq: Every day | ORAL | Status: DC

## 2013-04-15 NOTE — Telephone Encounter (Signed)
No request received from [facility], but new prescription for Lipitor sent to [facility] on [address]. Please notify patient.

## 2013-04-15 NOTE — Telephone Encounter (Signed)
Will fwd to Md.  Rance Smithson L, CMA  

## 2013-04-16 ENCOUNTER — Other Ambulatory Visit: Payer: Self-pay | Admitting: *Deleted

## 2013-04-16 DIAGNOSIS — E785 Hyperlipidemia, unspecified: Secondary | ICD-10-CM

## 2013-04-16 NOTE — Telephone Encounter (Signed)
Pt notified.  Delisia Mcquiston L, CMA  

## 2013-05-23 ENCOUNTER — Encounter: Payer: Self-pay | Admitting: Family Medicine

## 2013-05-23 ENCOUNTER — Ambulatory Visit (INDEPENDENT_AMBULATORY_CARE_PROVIDER_SITE_OTHER): Payer: BC Managed Care – PPO | Admitting: Family Medicine

## 2013-05-23 VITALS — BP 119/80 | HR 89 | Temp 98.2°F | Ht 67.0 in | Wt 240.0 lb

## 2013-05-23 DIAGNOSIS — H53121 Transient visual loss, right eye: Secondary | ICD-10-CM

## 2013-05-23 DIAGNOSIS — H53129 Transient visual loss, unspecified eye: Secondary | ICD-10-CM

## 2013-05-23 DIAGNOSIS — Z23 Encounter for immunization: Secondary | ICD-10-CM

## 2013-05-23 DIAGNOSIS — H53139 Sudden visual loss, unspecified eye: Secondary | ICD-10-CM

## 2013-05-23 DIAGNOSIS — R208 Other disturbances of skin sensation: Secondary | ICD-10-CM | POA: Insufficient documentation

## 2013-05-23 DIAGNOSIS — E669 Obesity, unspecified: Secondary | ICD-10-CM

## 2013-05-23 DIAGNOSIS — H53131 Sudden visual loss, right eye: Secondary | ICD-10-CM

## 2013-05-23 DIAGNOSIS — R209 Unspecified disturbances of skin sensation: Secondary | ICD-10-CM

## 2013-05-23 NOTE — Assessment & Plan Note (Signed)
Assessment: given patient's relatively quick and complete resolution of monocular blindness, I would favor a vascular cause over ophthalmologic or neurologic cause such as neuritis or complex migraine Plan:  - Obtain sedimentation rate to rule out giant cell arteritis - Obtain carotid ultrasound to evaluate for plaques - Obtain 2-D cardiac echo to look for vegetations - Continue with aspirin and lipid medication - Patient encouraged to return to ophthalmologist

## 2013-05-23 NOTE — Assessment & Plan Note (Signed)
Assessment: no clear etiology is patient's feet are neurovascularly intact and without any cutaneous manifestations indicative of inflammation or reaction Plan: patient's concern for having diabetes, and with a family history for diabetes and obesity I will check her in clinic A1c however I doubt that this is all related to the burning in her feet; for the burning in her feet we'll continue to observe

## 2013-05-23 NOTE — Progress Notes (Signed)
  Subjective:    Patient ID: Holly Dunn, female    DOB: July 29, 1966, 47 y.o.   MRN: [id number]  HPI  47 year old female with a history of hypertension and nonobstructive coronary artery disease who presents for followup.  Transient Monocular Vision Loss - Right sided, sudden, complete loss of vision, no pain, no illness, was having a headache for three days prior so she went to an urgent care on [address] and received a injection of medication for her headache, duration 30 minutes with slow resolution, denies left sided weakness, dysphagia, dysarthria or facial droop  Feet Burning - Bilateral, tingling, duration 1 week, no history of similar symptoms, no worsening with activity, not tried anything for relief   Review of Systems See history of present illness    Objective:   Physical Exam BP 119/80  Pulse 89  Temp(Src) 98.2 F (36.8 C) (Oral)  Ht 5\' 7"  (1.702 m)  Wt 240 lb (108.863 kg)  BMI 37.58 kg/m2  Gen.: Middle-age white female, obese, non-ill-appearing Funduscopic exam: limited visualization of optic disc due to nondilated exam, but no evidence of retinal hemorrhage Cardiovascular: regular rate and rhythm, 2/6 crescendo decrescendo murmur heard loudest at the right upper sternal border; no carotid bruits Lungs: normal breathing, clear to auscultation bilaterally Neurologic: cranial nerves II through XII intact, no focal deficits Feet: Normal-appearing, skin warm and dry, 2+ posterior tibial pulses, sensation intact to light touch     Assessment & Plan:

## 2013-05-23 NOTE — Patient Instructions (Addendum)
Ms. Furr,   We need to get some more information about your heart and blood vessels to make sure that is not that cause of your blindness. Also, we will od one blood test for inflammation of the vessels. Also, please go see your ophthalmologist.   For your feet, I don't know the exact cause. I think we should follow and see how it goes.   Please follow up in 1 month and I will contact you with study results.   Sincerely,   Dr. Clinton Sawyer

## 2013-05-24 ENCOUNTER — Ambulatory Visit (HOSPITAL_COMMUNITY)
Admission: RE | Admit: 2013-05-24 | Discharge: 2013-05-24 | Disposition: A | Discharge status: Home / Self Care | Payer: BC Managed Care – PPO | Source: Ambulatory Visit | Attending: Family Medicine | Admitting: Family Medicine

## 2013-05-24 ENCOUNTER — Other Ambulatory Visit (HOSPITAL_COMMUNITY): Payer: Self-pay | Admitting: Family Medicine

## 2013-05-24 ENCOUNTER — Telehealth: Payer: Self-pay | Admitting: *Deleted

## 2013-05-24 ENCOUNTER — Other Ambulatory Visit (HOSPITAL_COMMUNITY): Payer: Self-pay | Admitting: Unknown Physician Specialty

## 2013-05-24 DIAGNOSIS — I10 Essential (primary) hypertension: Secondary | ICD-10-CM | POA: Insufficient documentation

## 2013-05-24 DIAGNOSIS — H53139 Sudden visual loss, unspecified eye: Secondary | ICD-10-CM

## 2013-05-24 DIAGNOSIS — H3400 Transient retinal artery occlusion, unspecified eye: Secondary | ICD-10-CM | POA: Insufficient documentation

## 2013-05-24 DIAGNOSIS — E785 Hyperlipidemia, unspecified: Secondary | ICD-10-CM | POA: Insufficient documentation

## 2013-05-24 DIAGNOSIS — H53131 Sudden visual loss, right eye: Secondary | ICD-10-CM

## 2013-05-24 DIAGNOSIS — R51 Headache: Secondary | ICD-10-CM | POA: Insufficient documentation

## 2013-05-24 NOTE — Telephone Encounter (Signed)
I called [employer] for authorization for 2D Echo for patient.  Received Authorization number, [id number].  Called [place] and scheduled 2D Echo for May 31, 2013 at 1pm.  Pt notified and is agreeable.  Vascular Lab was given the authorization number.  Mamta Rimmer, Darlyne Russian, CMA

## 2013-05-24 NOTE — Progress Notes (Signed)
VASCULAR LAB PRELIMINARY  PRELIMINARY  PRELIMINARY  PRELIMINARY  Carotid duplex completed.    Preliminary report:  No evidence of carotid stenosis. Vertebral artery flow is antegrade  Holly Dunn, RVS 05/24/2013, 11:22 AM

## 2013-05-27 ENCOUNTER — Telehealth: Payer: Self-pay | Admitting: Family Medicine

## 2013-05-27 NOTE — Telephone Encounter (Signed)
Patient informed that no evidence of ICA stenosis found on ultrasound. She acknowledged understanding.

## 2013-05-31 ENCOUNTER — Ambulatory Visit (HOSPITAL_COMMUNITY)
Admission: RE | Admit: 2013-05-31 | Discharge: 2013-05-31 | Disposition: A | Discharge status: Home / Self Care | Payer: BC Managed Care – PPO | Source: Ambulatory Visit | Attending: Family Medicine | Admitting: Family Medicine

## 2013-05-31 DIAGNOSIS — H53131 Sudden visual loss, right eye: Secondary | ICD-10-CM

## 2013-05-31 DIAGNOSIS — G459 Transient cerebral ischemic attack, unspecified: Secondary | ICD-10-CM | POA: Insufficient documentation

## 2013-05-31 DIAGNOSIS — I517 Cardiomegaly: Secondary | ICD-10-CM

## 2013-05-31 DIAGNOSIS — I359 Nonrheumatic aortic valve disorder, unspecified: Secondary | ICD-10-CM | POA: Insufficient documentation

## 2013-05-31 DIAGNOSIS — I059 Rheumatic mitral valve disease, unspecified: Secondary | ICD-10-CM | POA: Insufficient documentation

## 2013-06-04 ENCOUNTER — Encounter: Payer: Self-pay | Admitting: Family Medicine

## 2013-06-05 ENCOUNTER — Telehealth: Payer: Self-pay | Admitting: Family Medicine

## 2013-06-05 NOTE — Telephone Encounter (Signed)
I called the patient noted her 2-D echocardiogram was normal. I encouraged her to follow with her ophthalmologist about the transient monocular vision loss. She agreed to do this in the near future abdomen results forwarded to my office.

## 2013-06-19 ENCOUNTER — Ambulatory Visit (INDEPENDENT_AMBULATORY_CARE_PROVIDER_SITE_OTHER): Payer: BC Managed Care – PPO | Admitting: Family Medicine

## 2013-06-19 ENCOUNTER — Encounter: Payer: Self-pay | Admitting: Family Medicine

## 2013-06-19 VITALS — BP 106/63 | HR 93 | Ht 67.0 in | Wt 234.0 lb

## 2013-06-19 DIAGNOSIS — H547 Unspecified visual loss: Secondary | ICD-10-CM

## 2013-06-19 NOTE — Patient Instructions (Signed)
Dear Ms. Parma,   You are a hard working woman.  I am pleased that you are feeling better and encouraged that all of you studies are normal.   If things are going well, then please come back in 6 mos.   Sincerely,   Dr. Clinton Sawyer

## 2013-06-19 NOTE — Progress Notes (Signed)
  Subjective:    Patient ID: Holly Dunn, female    DOB: 03-Feb-1966, 47 y.o.   MRN: [id number]  HPI  47 year old with CAD, HTN and HLD who presents as follow up for monocular vision loss and burning in her feet.   Vision Loss - since last evaluation has had an echocardiogram and doppler ultrasound of her carotid ateries which were both normal, continued on aspirin and statin; patient has not seen ophthalmologist as recommended, she denies any right sided vision loss similar to previous episode  Tingling in Toes - Seen for this problem on 05/23/13, no clear etiology with normal physical exam, A1C was 5.0, patient says that the symptoms have completely resolved and believes that it was related to prolonged standing at work    Review of Systems Negative unless stated in HPI    Objective:   Physical Exam BP 106/63  Pulse 93  Ht 5\' 7"  (1.702 m)  Wt 234 lb (106.142 kg)  BMI 36.64 kg/m2  No exam performed since all symptoms have resolved       Assessment & Plan:  Continue current medical treatment and encouraged patient to follow up with ophthalmologist

## 2013-06-20 ENCOUNTER — Ambulatory Visit: Payer: BC Managed Care – PPO | Admitting: Family Medicine

## 2013-10-02 ENCOUNTER — Encounter: Payer: Self-pay | Admitting: Family Medicine

## 2013-10-02 ENCOUNTER — Ambulatory Visit (INDEPENDENT_AMBULATORY_CARE_PROVIDER_SITE_OTHER): Payer: No Typology Code available for payment source | Admitting: Family Medicine

## 2013-10-02 VITALS — BP 121/82 | HR 92 | Temp 98.0°F | Ht 67.0 in | Wt 244.8 lb

## 2013-10-02 DIAGNOSIS — N63 Unspecified lump in unspecified breast: Secondary | ICD-10-CM

## 2013-10-02 NOTE — Patient Instructions (Signed)
Dear Ms.  Holly Dunn,   Thank you for coming to clinic today. Please read below regarding the issues that we discussed.   Breast - I do not see anything concerning about the left breast at this time. Please schedule your regular annual mammogram. If anything changes, then please let me know.   Please follow up in clinic in 3 months to follow up your blood pressure. Please call earlier if you have any questions or concerns.   Sincerely,   Dr. Clinton SawyerWilliamson

## 2013-10-02 NOTE — Progress Notes (Signed)
   Subjective:    Patient ID: Holly Dunn, female    DOB: [birth year], 48 y.o.   MRN: [id number]  HPI  48 y.o. F with HTN, tobacco abuse who presents with a swollen left breast. It is non tender, no redness or drainage. No rashes. No recent trauma. Mammogram 08/2012. Had diagnostic mammogram March 2014 that recommended only f/u in 1 year. No ultrasound performed. Pt also had cyst drainage from left breast in February 2014.   Review of Systems     Objective:   Physical Exam BP 121/82  Pulse 92  Temp(Src) 98 F (36.7 C) (Oral)  Ht 5\' 7"  (1.702 m)  Wt 244 lb 12.8 oz (111.041 kg)  BMI 38.33 kg/m2  LMP 07/02/2013  Gen: well appearing, non distressed,  Breast: right larger than left, no swelling or tenderess of either breast. No palpable masses, no nipple drainage, no axillary lymphadenopathy or tenderness Skin: flat hyperpigmented macules under mammillary folds of breast      Assessment & Plan:  Normal appearing and normal palpation of breast tissue bilaterally. Recommended annual screening mammogram.

## 2013-11-12 ENCOUNTER — Other Ambulatory Visit: Payer: Self-pay

## 2013-11-12 DIAGNOSIS — Z1231 Encounter for screening mammogram for malignant neoplasm of breast: Secondary | ICD-10-CM

## 2013-11-21 ENCOUNTER — Ambulatory Visit
Admission: RE | Admit: 2013-11-21 | Discharge: 2013-11-21 | Disposition: A | Discharge status: Home / Self Care | Payer: No Typology Code available for payment source | Source: Ambulatory Visit

## 2013-11-21 DIAGNOSIS — Z1231 Encounter for screening mammogram for malignant neoplasm of breast: Secondary | ICD-10-CM

## 2013-12-18 ENCOUNTER — Encounter: Payer: Self-pay | Admitting: Family Medicine

## 2013-12-18 ENCOUNTER — Ambulatory Visit (INDEPENDENT_AMBULATORY_CARE_PROVIDER_SITE_OTHER): Payer: No Typology Code available for payment source | Admitting: Family Medicine

## 2013-12-18 VITALS — BP 129/83 | HR 99 | Ht 67.0 in | Wt 249.0 lb

## 2013-12-18 DIAGNOSIS — R231 Pallor: Secondary | ICD-10-CM

## 2013-12-18 DIAGNOSIS — R21 Rash and other nonspecific skin eruption: Secondary | ICD-10-CM

## 2013-12-18 LAB — COMPREHENSIVE METABOLIC PANEL
ALBUMIN: 4.5 g/dL (ref 3.5–5.2)
ALT: 22 U/L (ref 0–35)
AST: 16 U/L (ref 0–37)
Alkaline Phosphatase: 79 U/L (ref 39–117)
BUN: 10 mg/dL (ref 6–23)
CALCIUM: 9.9 mg/dL (ref 8.4–10.5)
CHLORIDE: 101 meq/L (ref 96–112)
CO2: 30 meq/L (ref 19–32)
Creat: 0.82 mg/dL (ref 0.50–1.10)
Glucose, Bld: 116 mg/dL — ABNORMAL HIGH (ref 70–99)
POTASSIUM: 4.2 meq/L (ref 3.5–5.3)
SODIUM: 141 meq/L (ref 135–145)
TOTAL PROTEIN: 7.1 g/dL (ref 6.0–8.3)
Total Bilirubin: 0.5 mg/dL (ref 0.2–1.2)

## 2013-12-18 LAB — CBC
HCT: 39.4 % (ref 36.0–46.0)
Hemoglobin: 13.9 g/dL (ref 12.0–15.0)
MCH: 33 pg (ref 26.0–34.0)
MCHC: 35.3 g/dL (ref 30.0–36.0)
MCV: 93.6 fL (ref 78.0–100.0)
PLATELETS: 353 10*3/uL (ref 150–400)
RBC: 4.21 MIL/uL (ref 3.87–5.11)
RDW: 12.5 % (ref 11.5–15.5)
WBC: 8.8 10*3/uL (ref 4.0–10.5)

## 2013-12-18 LAB — C-REACTIVE PROTEIN: CRP: 0.5 mg/dL (ref <0.60)

## 2013-12-18 LAB — POCT SEDIMENTATION RATE: POCT SED RATE: 28 mm/h — AB (ref 0–22)

## 2013-12-18 NOTE — Assessment & Plan Note (Signed)
A: Pattern consistent with livedo reticularis but more painful and longer duration than I expect. I am concerned that this may represent an undiagnosed vasculitis, given her history of transient monocular blindness. It could also be an occult lupus, malignancy or idiopathic.  P:  - Plan for punch biopsy of lesions on Friday when I have more time - Check sed rate, CRP, CBC, and CMP today

## 2013-12-18 NOTE — Patient Instructions (Signed)
Ms. Holly Dunn,   Let's get some labs today and then have you come back on Friday for a biopsy. Come at [facility]2PM.   Take Care,   Dr. Clinton SawyerWilliamson
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
## 2013-12-20 ENCOUNTER — Ambulatory Visit (INDEPENDENT_AMBULATORY_CARE_PROVIDER_SITE_OTHER): Payer: Self-pay | Admitting: Family Medicine

## 2013-12-20 ENCOUNTER — Encounter: Payer: Self-pay | Admitting: Family Medicine

## 2013-12-20 VITALS — BP 121/79 | HR 101 | Temp 98.9°F | Ht 67.0 in | Wt 249.0 lb

## 2013-12-20 DIAGNOSIS — R231 Pallor: Secondary | ICD-10-CM

## 2013-12-20 NOTE — Patient Instructions (Signed)
Dear Janyth ContesEubanks,   Thank you for coming to clinic today. Please read below regarding the issues that we discussed.   1. Rash  - Please keep the biopsy sites dry for the next 24-48 hours. - You may wash it gently after that time and cover them with antibiotic ointment and a bandage - If there is bleeding or drainage or redness or swelling, and please followup ASAP - I should have the results within a week and will let you know   Sincerely,   Dr. Clinton SawyerWilliamson

## 2013-12-20 NOTE — Progress Notes (Signed)
   Subjective:    Patient ID: Rosalee Kaufmanngela B Skerritt, female    DOB: 05-13-1966, 48 y.o.   MRN: [id number]  HPI  48 year old F presenting for biopsy of reticular rash on her back.   Review of Systems     Objective:   Physical Exam   Livedo reticulars of central back      Assessment & Plan:   Punch Biopsy Procedure Note  Pre-operative Diagnosis: livedo reticularis  Post-operative Diagnosis: same  Locations:middle back  Indications: diagnostic  Anesthesia: Lidocaine 2% with epinephrine without added sodium bicarbonate  Procedure Details  History of allergy to iodine: no Patient informed of the risks (including bleeding and infection) and benefits of the  procedure and Written informed consent obtained.  The lesion and surrounding area was given a sterile prep using alcohol and draped in the usual sterile fashion. The skin was then stretched perpendicular to the skin tension lines and the lesion removed using the 6mm punch for the left lateral back. The procedure was repeated for a specimen on the central back. Silver nitrate was applied for hemostasis to both biopsy sites. Antibiotic ointment and a sterile dressing applied. The specimens were sent for pathologic examination. The patient tolerated the procedure well.  EBL: 20 ml  Findings: Rash as above  Condition: Stable  Complications: none.  Plan: 1. Instructed to keep the wound dry and covered for 24-48h and clean thereafter. 2. Warning signs of infection were reviewed.   3. Recommended that the patient use NSAID as needed for pain.

## 2013-12-26 ENCOUNTER — Telehealth: Payer: Self-pay | Admitting: Family Medicine

## 2013-12-26 DIAGNOSIS — R21 Rash and other nonspecific skin eruption: Secondary | ICD-10-CM

## 2013-12-26 NOTE — Telephone Encounter (Signed)
Pt informed that biopsies were non-diagnostic. Pt stated that she does not use heating pads or any other heat source on her back with regularity. I told her that I would make a referral to dermatology for further evaluation. Patient is agreeable to this plan.

## 2014-03-10 ENCOUNTER — Other Ambulatory Visit: Payer: Self-pay | Admitting: Family Medicine

## 2014-03-13 ENCOUNTER — Other Ambulatory Visit: Payer: Self-pay | Admitting: *Deleted

## 2014-03-13 DIAGNOSIS — I10 Essential (primary) hypertension: Secondary | ICD-10-CM

## 2014-03-13 NOTE — Telephone Encounter (Signed)
Patient calls, has been out for 2 days now. Please refill today!

## 2014-03-14 ENCOUNTER — Other Ambulatory Visit: Payer: Self-pay | Admitting: *Deleted

## 2014-03-14 DIAGNOSIS — I10 Essential (primary) hypertension: Secondary | ICD-10-CM

## 2014-03-16 NOTE — Telephone Encounter (Signed)
I have refilled pts lisinopril x90 w/ 0 refill. Please have pt make an appt to meet her new provider as well as to receive a longer prescription w/ refills  Thank you!  -Mickie HillierIan McKeag MD

## 2014-03-17 NOTE — Telephone Encounter (Signed)
Informed patient that Rx was sent in and she will need a referral for further refills.Holly Dunn, Rodena Medinobert Lee

## 2014-04-29 ENCOUNTER — Ambulatory Visit (INDEPENDENT_AMBULATORY_CARE_PROVIDER_SITE_OTHER): Payer: Self-pay | Admitting: Family Medicine

## 2014-04-29 ENCOUNTER — Encounter: Payer: Self-pay | Admitting: Family Medicine

## 2014-04-29 VITALS — BP 132/79 | HR 111 | Temp 98.6°F | Ht 67.0 in | Wt 252.0 lb

## 2014-04-29 DIAGNOSIS — R51 Headache: Secondary | ICD-10-CM

## 2014-04-29 DIAGNOSIS — R519 Headache, unspecified: Secondary | ICD-10-CM | POA: Insufficient documentation

## 2014-04-29 DIAGNOSIS — G44209 Tension-type headache, unspecified, not intractable: Secondary | ICD-10-CM

## 2014-04-29 MED ORDER — KETOROLAC TROMETHAMINE 30 MG/ML IJ SOLN: 30.0000 mg | Freq: Once | INTRAMUSCULAR | Status: DC

## 2014-04-29 MED ORDER — ONDANSETRON 4 MG PO TBDP
4.0000 mg | ORAL_TABLET | Freq: Once | ORAL | Status: AC
  Administered 2014-04-29: 4 mg via ORAL

## 2014-04-29 MED ORDER — KETOROLAC TROMETHAMINE 30 MG/ML IJ SOLN
30.0000 mg | Freq: Once | INTRAMUSCULAR | Status: AC
  Administered 2014-04-29: 30 mg via INTRAMUSCULAR

## 2014-04-29 NOTE — Addendum Note (Signed)
Addended by: Jennette Bill on: 04/29/2014 02:55 PM   Modules accepted: Orders

## 2014-04-29 NOTE — Progress Notes (Signed)
  Subjective:    Holly Dunn is a 48 y.o. female who presents for evaluation of headache. Symptoms began about 3 days ago. Generally, the headaches last about 2 hours and occur several times per day. The headaches do not seem to be related to any time of the day. The headaches are usually dull and are located in posterior occipital region/temporal region B/L.  The patient rates her most severe headaches a 7 on a scale from 1 to 10. Recently, the headaches have been stable. Work attendance or other daily activities are affected by the headaches. Precipitating factors include: none which have been determined. The headaches are usually not preceded by an aura. Associated neurologic symptoms: None. The patient denies decreased physical activity, dizziness, loss of balance, muscle weakness, numbness of extremities, speech difficulties, vision problems, vomiting in the early morning and worsening school/work performance. Home treatment has included ibuprofen with little improvement. Other history includes: nothing pertinent. Family history includes no known family members with significant headaches.  The following portions of the patient's history were reviewed and updated as appropriate: allergies, current medications, past family history, past medical history, past social history, past surgical history and problem list.  Review of Systems Pertinent items are noted in HPI.    Objective:    BP 132/79  Pulse 111  Temp(Src) 98.6 F (37 C) (Oral)  Ht  (1.702 m)  Wt 252 lb (114.306 kg)  BMI 39.46 kg/m2 General appearance: alert, cooperative and appears stated age Head: Normocephalic, without obvious abnormality, atraumatic Eyes: conjunctivae/corneas clear. PERRL, EOM's intact. Fundi benign. Ears: normal TM's and external ear canals both ears Nose: Nares normal. Septum midline. Mucosa normal. No drainage or sinus tenderness. Throat: lips, mucosa, and tongue normal; teeth and gums normal Heart:  regular rate and rhythm Neurologic: Alert and oriented X 3, normal strength and tone. Normal symmetric reflexes. Normal coordination and gait Cranial nerves: normal    Assessment:    Tension-type headache, episodic    Plan:  See individual A/P
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
## 2014-04-29 NOTE — Patient Instructions (Signed)

## 2014-05-08 ENCOUNTER — Emergency Department (HOSPITAL_COMMUNITY)
Admission: EM | Admit: 2014-05-08 | Discharge: 2014-05-09 | Disposition: A | Discharge status: Home / Self Care | Payer: Self-pay | Attending: Emergency Medicine | Admitting: Emergency Medicine

## 2014-05-08 ENCOUNTER — Emergency Department (HOSPITAL_COMMUNITY): Payer: Self-pay

## 2014-05-08 ENCOUNTER — Encounter (HOSPITAL_COMMUNITY): Payer: Self-pay | Admitting: Emergency Medicine

## 2014-05-08 DIAGNOSIS — F17200 Nicotine dependence, unspecified, uncomplicated: Secondary | ICD-10-CM | POA: Insufficient documentation

## 2014-05-08 DIAGNOSIS — Z7982 Long term (current) use of aspirin: Secondary | ICD-10-CM | POA: Insufficient documentation

## 2014-05-08 DIAGNOSIS — E785 Hyperlipidemia, unspecified: Secondary | ICD-10-CM | POA: Insufficient documentation

## 2014-05-08 DIAGNOSIS — Z9889 Other specified postprocedural states: Secondary | ICD-10-CM | POA: Insufficient documentation

## 2014-05-08 DIAGNOSIS — Z8781 Personal history of (healed) traumatic fracture: Secondary | ICD-10-CM | POA: Insufficient documentation

## 2014-05-08 DIAGNOSIS — I10 Essential (primary) hypertension: Secondary | ICD-10-CM | POA: Insufficient documentation

## 2014-05-08 DIAGNOSIS — R0602 Shortness of breath: Secondary | ICD-10-CM | POA: Insufficient documentation

## 2014-05-08 DIAGNOSIS — I2510 Atherosclerotic heart disease of native coronary artery without angina pectoris: Secondary | ICD-10-CM | POA: Insufficient documentation

## 2014-05-08 DIAGNOSIS — J705 Respiratory conditions due to smoke inhalation: Secondary | ICD-10-CM | POA: Insufficient documentation

## 2014-05-08 DIAGNOSIS — Z79899 Other long term (current) drug therapy: Secondary | ICD-10-CM | POA: Insufficient documentation

## 2014-05-08 DIAGNOSIS — T59811A Toxic effect of smoke, accidental (unintentional), initial encounter: Secondary | ICD-10-CM

## 2014-05-08 DIAGNOSIS — G43909 Migraine, unspecified, not intractable, without status migrainosus: Secondary | ICD-10-CM | POA: Insufficient documentation

## 2014-05-08 LAB — CBC WITH DIFFERENTIAL/PLATELET
BASOS ABS: 0.1 10*3/uL (ref 0.0–0.1)
Basophils Relative: 2 % — ABNORMAL HIGH (ref 0–1)
Eosinophils Absolute: 0.3 10*3/uL (ref 0.0–0.7)
Eosinophils Relative: 5 % (ref 0–5)
HEMATOCRIT: 37.7 % (ref 36.0–46.0)
Hemoglobin: 13.1 g/dL (ref 12.0–15.0)
Lymphocytes Relative: 40 % (ref 12–46)
Lymphs Abs: 2.4 10*3/uL (ref 0.7–4.0)
MCH: 33.6 pg (ref 26.0–34.0)
MCHC: 34.7 g/dL (ref 30.0–36.0)
MCV: 96.7 fL (ref 78.0–100.0)
Monocytes Absolute: 0.4 10*3/uL (ref 0.1–1.0)
Monocytes Relative: 7 % (ref 3–12)
NEUTROS ABS: 2.9 10*3/uL (ref 1.7–7.7)
Neutrophils Relative %: 46 % (ref 43–77)
PLATELETS: 277 10*3/uL (ref 150–400)
RBC: 3.9 MIL/uL (ref 3.87–5.11)
RDW: 12.3 % (ref 11.5–15.5)
WBC: 6.1 10*3/uL (ref 4.0–10.5)

## 2014-05-08 LAB — CARBOXYHEMOGLOBIN
Carboxyhemoglobin: 5.2 % (ref 0.5–1.5)
Methemoglobin: 1.2 % (ref 0.0–1.5)
O2 Saturation: 96 %
Total hemoglobin: 13.4 g/dL (ref 12.0–16.0)

## 2014-05-08 LAB — BASIC METABOLIC PANEL
ANION GAP: 16 — AB (ref 5–15)
BUN: 9 mg/dL (ref 6–23)
CHLORIDE: 101 meq/L (ref 96–112)
CO2: 21 mEq/L (ref 19–32)
CREATININE: 0.91 mg/dL (ref 0.50–1.10)
Calcium: 9.1 mg/dL (ref 8.4–10.5)
GFR calc Af Amer: 86 mL/min — ABNORMAL LOW (ref >90)
GFR calc non Af Amer: 74 mL/min — ABNORMAL LOW (ref >90)
Glucose, Bld: 115 mg/dL — ABNORMAL HIGH (ref 70–99)
Potassium: 3.8 mEq/L (ref 3.7–5.3)
SODIUM: 138 meq/L (ref 137–147)

## 2014-05-08 LAB — PRO B NATRIURETIC PEPTIDE: Pro B Natriuretic peptide (BNP): 25.7 pg/mL (ref 0–125)

## 2014-05-08 LAB — TROPONIN I

## 2014-05-08 MED ORDER — ALBUTEROL SULFATE HFA 108 (90 BASE) MCG/ACT IN AERS: 1.0000 | INHALATION_SPRAY | Freq: Four times a day (QID) | RESPIRATORY_TRACT | Status: AC | PRN

## 2014-05-08 MED ORDER — IPRATROPIUM-ALBUTEROL 0.5-2.5 (3) MG/3ML IN SOLN
3.0000 mL | Freq: Once | RESPIRATORY_TRACT | Status: AC
  Administered 2014-05-08: 3 mL via RESPIRATORY_TRACT
  Filled 2014-05-08: qty 3
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
## 2014-05-08 NOTE — Progress Notes (Signed)
Carboxy was 5.2% RT made RN aware of panic value
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
## 2014-05-09 NOTE — ED Notes (Signed)
Pt ambulated with steady gait and even respirations, maintaining O2 sat of 100%.

## 2014-06-03 ENCOUNTER — Encounter: Payer: Self-pay | Admitting: Family Medicine

## 2014-06-03 ENCOUNTER — Ambulatory Visit (INDEPENDENT_AMBULATORY_CARE_PROVIDER_SITE_OTHER): Payer: Self-pay | Admitting: Family Medicine

## 2014-06-03 VITALS — BP 113/79 | HR 101 | Temp 97.9°F | Wt 251.0 lb

## 2014-06-03 DIAGNOSIS — J069 Acute upper respiratory infection, unspecified: Secondary | ICD-10-CM

## 2014-06-03 DIAGNOSIS — B9789 Other viral agents as the cause of diseases classified elsewhere: Secondary | ICD-10-CM

## 2014-06-03 DIAGNOSIS — Z720 Tobacco use: Secondary | ICD-10-CM

## 2014-06-03 MED ORDER — HYDROCODONE-HOMATROPINE 5-1.5 MG/5ML PO SYRP: 5.0000 mL | ORAL_SOLUTION | Freq: Four times a day (QID) | ORAL | Status: DC | PRN

## 2014-06-03 NOTE — Assessment & Plan Note (Signed)
Day 7, gradually worsening in chronic smoker, suspect just delayed resolution of common cold. No signs of secondary infections. Symptomatic management and RTC criteria reviewed.

## 2014-06-03 NOTE — Progress Notes (Signed)
Patient ID: Holly Dunn Gragert, female   DOB: June 29, 1966, 48 y.o.   MRN: [id number]   Subjective:  Holly Dunn Cartelli is a 48 y.o. female smoker here for cold and cough.  This is day 7 of gradually worsening cough, runny nose, congestion, raspy voice. Denies HA, muscle aches, fever, sore throat, sick contacts. Tried delsym, day time and night time symptom relief medications with little improvement. She has not been able to smoke during this time.   All other pertinent systems reviewed and are negative. Objective:  BP 113/79  Pulse 101  Temp(Src) 97.9 F (36.6 C) (Oral)  Wt 251 lb (113.853 kg)  Gen: alert, ill/tired appearing F in no distress  Head: Normocephalic, without obvious abnormality, atraumatic  Eyes: conjunctivae/corneas clear. PERRL, EOM's intact.  Ears: TM's without effusion, retraction or inflammation bilaterally, though prominent pars flaccida; and external ear canals both ears  Nose: Nares normal. Septum midline. Mucosa normal. No drainage or sinus tenderness.  Throat: good dentition, lips, mucosa, and tongue normal; gums normal  Neck: no adenopathy, no carotid bruit, no JVD, supple, symmetrical, trachea midline and thyroid not enlarged, symmetric, no tenderness/mass/nodules  Lungs: respirations non-labored, clear to auscultation bilaterally  Heart: regular rate and rhythm, S1, S2 normal, no murmur, click, rub or gallop  Assessment & Plan:  Holly Dunn Velaquez is a 48 y.o. female with:  Problem List Items Addressed This Visit   None

## 2014-06-03 NOTE — Patient Instructions (Signed)
Drink adequate fluids.  Use nasal saline as needed to help clear runny nose. Take warm showers and drink hot tea - the humid air will help with runny nose and congestion. Take 1 tbsp of honey every 2 hours to help with cough and sore throat. Take the cough syrup to help with the cough before you try to get some rest.  Take tylenol every 6 hours as directed.

## 2014-06-23 ENCOUNTER — Encounter: Payer: Self-pay | Admitting: Family Medicine

## 2014-07-11 ENCOUNTER — Ambulatory Visit (INDEPENDENT_AMBULATORY_CARE_PROVIDER_SITE_OTHER): Payer: Self-pay | Admitting: Family Medicine

## 2014-07-11 ENCOUNTER — Ambulatory Visit (INDEPENDENT_AMBULATORY_CARE_PROVIDER_SITE_OTHER): Payer: Self-pay | Admitting: *Deleted

## 2014-07-11 ENCOUNTER — Encounter: Payer: Self-pay | Admitting: Family Medicine

## 2014-07-11 VITALS — BP 131/82 | HR 89 | Temp 98.2°F | Ht 67.0 in | Wt 248.5 lb

## 2014-07-11 DIAGNOSIS — L729 Follicular cyst of the skin and subcutaneous tissue, unspecified: Secondary | ICD-10-CM

## 2014-07-11 DIAGNOSIS — Z23 Encounter for immunization: Secondary | ICD-10-CM

## 2014-07-11 DIAGNOSIS — E785 Hyperlipidemia, unspecified: Secondary | ICD-10-CM

## 2014-07-11 LAB — CBC WITH DIFFERENTIAL/PLATELET
BASOS ABS: 0.1 10*3/uL (ref 0.0–0.1)
Basophils Relative: 1 % (ref 0–1)
Eosinophils Absolute: 0.3 10*3/uL (ref 0.0–0.7)
Eosinophils Relative: 4 % (ref 0–5)
HEMATOCRIT: 38.8 % (ref 36.0–46.0)
Hemoglobin: 13.6 g/dL (ref 12.0–15.0)
LYMPHS ABS: 2.3 10*3/uL (ref 0.7–4.0)
Lymphocytes Relative: 32 % (ref 12–46)
MCH: 33.5 pg (ref 26.0–34.0)
MCHC: 35.1 g/dL (ref 30.0–36.0)
MCV: 95.6 fL (ref 78.0–100.0)
MPV: 10 fL (ref 9.4–12.4)
Monocytes Absolute: 0.4 10*3/uL (ref 0.1–1.0)
Monocytes Relative: 6 % (ref 3–12)
NEUTROS PCT: 57 % (ref 43–77)
Neutro Abs: 4 10*3/uL (ref 1.7–7.7)
Platelets: 340 10*3/uL (ref 150–400)
RBC: 4.06 MIL/uL (ref 3.87–5.11)
RDW: 12.9 % (ref 11.5–15.5)
WBC: 7.1 10*3/uL (ref 4.0–10.5)

## 2014-07-11 MED ORDER — LISINOPRIL 20 MG PO TABS: 20.0000 mg | ORAL_TABLET | Freq: Every day | ORAL | Status: AC

## 2014-07-11 MED ORDER — LOVASTATIN 40 MG PO TABS: 40.0000 mg | ORAL_TABLET | Freq: Every day | ORAL | Status: DC

## 2014-07-11 NOTE — Progress Notes (Signed)
   HPI  Patient presents today for patient is here for medication refill and for painful sore in her right axilla. Patient is here for medication refill and to meet her new primary care provider as well as to address a painful sore she noticed yesterday in her axilla. She states that she has been in relatively good health of late. No significant issues. She states that yesterday she was taking a bath and recognized that she had a painful mass in her right axilla. No visible sore. She denies any drainage from this site. She states that she has had something very similar to this in the past. Previous episodes required the lancing of the cyst.   Agent denies any fevers chills headache nausea vomiting diarrhea diaphoresis drainage from the affected site.  Smoking status noted- patient states that she has been trying to decrease the amount of smoking but is not yet ready to quit ROS: Per HPI  Objective: BP 131/82 mmHg  Pulse 89  Temp(Src) 98.2 F (36.8 C) (Oral)  Ht 5\' 7"  (1.702 m)  Wt 248 lb 8 oz (112.719 kg)  BMI 38.91 kg/m2  LMP 07/02/2013 Gen: NAD, alert, cooperative with exam HEENT: NCAT, EOMI, PERRL CV: RRR, good S1/S2, 2/6 systolic murmur noted greatest at the aortic valve. Radiates to the carotid. Resp: CTABL, no wheezes, non-labored Abd: SNTND, BS present, no guarding or organomegaly Ext: Firm tender mass noted in the 3:00 region of the right axilla, no discoloration, no discharge Neuro: Alert and oriented, No gross deficits  Assessment and plan:  Subcutaneous cyst Firm tender mass noted at the 3:00 region of her right axilla with no discoloration and no discharge present. Patient noticed this mass yesterday while bathing. Patient has a history of previous subcutaneous nodules. Majority of these in the past required lancing. No fever chills diaphoresis nausea vomiting or diarrhea present today in the clinic.  - I will continue to monitor this lesion. - CBC with differential, BMP  obtained today. - I've asked patient to schedule follow-up in our office if lesion does not dissipate over the next couple weeks. At which point lancing of this cyst may be warranted. - No antibiotics necessary at this time. - Patient informed of red flags to watch for and the need to report to the ED if any present themselves.  Hyperlipidemia LDL goal <100 Patient has not been on her Lipitor for quite some time secondary to cost. Patient is also not had a lipid panel performed within the past year.  - Lipid panel ordered today. Did not find it necessary to require a fasting lipid panel secondary to past triglyceride levels being within normal range. - Lovastatin 40 mg. This medication is on [facility]'s $4 list and should provide adequate lipid lowering capabilities.    Orders Placed This Encounter  Procedures  . Lipid Panel  . CBC with Differential  . BASIC METABOLIC PANEL WITH GFR    Meds ordered this encounter  Medications  . lisinopril (PRINIVIL,ZESTRIL) 20 MG tablet    Sig: Take 1 tablet (20 mg total) by mouth daily.    Dispense:  30 tablet    Refill:  5  . lovastatin (MEVACOR) 40 MG tablet    Sig: Take 1 tablet (40 mg total) by mouth at bedtime.    Dispense:  90 tablet    Refill:  3     Kathee DeltonIan D Tra Wilemon, MD,MS,  PGY1 07/11/2014 6:09 PM

## 2014-07-11 NOTE — Assessment & Plan Note (Signed)
Patient has not been on her Lipitor for quite some time secondary to cost. Patient is also not had a lipid panel performed within the past year.  - Lipid panel ordered today. Did not find it necessary to require a fasting lipid panel secondary to past triglyceride levels being within normal range. - Lovastatin 40 mg. This medication is on [facility]'s $4 list and should provide adequate lipid lowering capabilities.

## 2014-07-11 NOTE — Assessment & Plan Note (Signed)
Firm tender mass noted at the 3:00 region of her right axilla with no discoloration and no discharge present. Patient noticed this mass yesterday while bathing. Patient has a history of previous subcutaneous nodules. Majority of these in the past required lancing. No fever chills diaphoresis nausea vomiting or diarrhea present today in the clinic.  - I will continue to monitor this lesion. - CBC with differential, BMP obtained today. - I've asked patient to schedule follow-up in our office if lesion does not dissipate over the next couple weeks. At which point lancing of this cyst may be warranted. - No antibiotics necessary at this time. - Patient informed of red flags to watch for and the need to report to the ED if any present themselves.

## 2014-07-11 NOTE — Patient Instructions (Signed)
It was a pleasure seeing you today in our clinic. Today we discussed medication refill and the cyst under your arm. Here is the treatment plan we have discussed and agreed upon together:   - Today I've ordered lab tests to be performed. Labs ordered today: CBC with differential, BMP, lipid panel. - I've refilled your lisinopril. - You're to start taking lovastatin 40 mg every day. This is a similar medication to your previous Lipitor, however at [facility] it is on the $4 list. - As for the mass in your right arm pit, I would like you to keep an eye on this over the next 2 weeks. At this time I do not believe it necessary to lancets or treatment of medications. If you notice that it becomes very red and irritated or begins to drain fluid then you need to be seen by a medical professional immediately. If this mass does not diminish over the next 2-4 weeks and I would like to see you back in 4 weeks.

## 2014-07-12 LAB — LIPID PANEL
CHOL/HDL RATIO: 8 ratio
Cholesterol: 304 mg/dL — ABNORMAL HIGH (ref 0–200)
HDL: 38 mg/dL — AB (ref >39)
LDL Cholesterol: 214 mg/dL — ABNORMAL HIGH (ref 0–99)
Triglycerides: 258 mg/dL — ABNORMAL HIGH (ref <150)
VLDL: 52 mg/dL — AB (ref 0–40)

## 2014-07-12 LAB — BASIC METABOLIC PANEL WITH GFR
BUN: 11 mg/dL (ref 6–23)
CALCIUM: 9.4 mg/dL (ref 8.4–10.5)
CO2: 26 mEq/L (ref 19–32)
Chloride: 102 mEq/L (ref 96–112)
Creat: 0.86 mg/dL (ref 0.50–1.10)
GFR, Est Non African American: 80 mL/min
Glucose, Bld: 82 mg/dL (ref 70–99)
Potassium: 4.5 mEq/L (ref 3.5–5.3)
SODIUM: 138 meq/L (ref 135–145)

## 2014-07-29 ENCOUNTER — Ambulatory Visit: Payer: Self-pay | Admitting: Family Medicine

## 2014-07-30 ENCOUNTER — Encounter: Payer: Self-pay | Admitting: Family Medicine

## 2014-07-30 ENCOUNTER — Ambulatory Visit (INDEPENDENT_AMBULATORY_CARE_PROVIDER_SITE_OTHER): Payer: Self-pay | Admitting: Family Medicine

## 2014-07-30 VITALS — BP 131/70 | HR 76 | Temp 97.9°F | Ht 67.0 in | Wt 243.0 lb

## 2014-07-30 DIAGNOSIS — R339 Retention of urine, unspecified: Secondary | ICD-10-CM

## 2014-07-30 DIAGNOSIS — R8290 Unspecified abnormal findings in urine: Secondary | ICD-10-CM

## 2014-07-30 DIAGNOSIS — N390 Urinary tract infection, site not specified: Secondary | ICD-10-CM

## 2014-07-30 LAB — POCT URINALYSIS DIPSTICK
Bilirubin, UA: NEGATIVE
Glucose, UA: NEGATIVE
Ketones, UA: NEGATIVE
NITRITE UA: NEGATIVE
PH UA: 6
Protein, UA: NEGATIVE
UROBILINOGEN UA: 0.2

## 2014-07-30 LAB — POCT UA - MICROSCOPIC ONLY

## 2014-07-30 MED ORDER — CEPHALEXIN 500 MG PO CAPS: 500.0000 mg | ORAL_CAPSULE | Freq: Two times a day (BID) | ORAL | Status: AC

## 2014-07-30 NOTE — Assessment & Plan Note (Signed)
Symptoms consistent with UTI however patient's urinalysis does not suggest UTI. Will obtain urine culture. In the interim, will prescribe Keflex 500mg  BID x3 days to treat presumed infection. Patient instructed to return if symptoms worsen.

## 2014-07-30 NOTE — Progress Notes (Signed)
    Subjective   Holly Dunn is a 48 y.o. female that presents for a same day visit  1. Urgency: Symptoms started yesterday with urgency, hesitancy and frequency with associated dysuria. No fever, nausea, vomiting, chills. No gross hematuria. Has never had a urinary tract infection. No new sexual partners. Has a 3 month history of stress incontinence. She smokes 1/2PPD for 15 years. Last cigarette 5 days.   History  Substance Use Topics  . Smoking status: Current Every Day Smoker -- 0.50 packs/day for 20 years    Types: Cigarettes  . Smokeless tobacco: Never Used     Comment: 10/02/13-Per pt she is tring to quit--recently restarted (jar)  . Alcohol Use: No    ROS Per HPI  Objective   BP 131/70 mmHg  Pulse 76  Temp(Src) 97.9 F (36.6 C) (Oral)  Ht 5\' 7"  (1.702 m)  Wt 243 lb (110.224 kg)  BMI 38.05 kg/m2  LMP 07/02/2013  General: Well appearing female in no distress Genitourinary: No suprapubic tenderness  Assessment and Plan   Please refer to problem based charting of assessment and plan

## 2014-07-30 NOTE — Patient Instructions (Signed)
Thank you for coming to see me today. It was a pleasure. Today we talked about:   Urinary symptoms: you may have a urinary tract infection. I will prescribe a short course of antibiotics: Keflex 500mg , two pills daily for 3 days. Please take all pills even if you feel better. I am also going to get a urine culture to see if any bacteria grow. If your symptoms do not improve with treatment, we may need to consider other reasons for your symptoms. At that point, please return to the clinic for follow-up.  If you have any questions or concerns, please do not hesitate to call the office at [phone].  Sincerely,  Jacquelin Hawkingalph Nettey, MD

## 2014-08-02 LAB — URINE CULTURE: Colony Count: 50000

## 2014-08-04 ENCOUNTER — Telehealth: Payer: Self-pay | Admitting: *Deleted

## 2014-08-04 NOTE — Telephone Encounter (Signed)
-----   Message from Jacquelin Hawkingalph Nettey, MD sent at 08/03/2014  5:26 PM EST ----- Patient's urine culture with some bacterial growth. Not high enough for UTI, but patient already treated. Please inform patient of her results and that the antibiotic she took should clear her infection.

## 2014-08-04 NOTE — Telephone Encounter (Signed)
LVM for patient to  Call back to inform her of below results

## 2014-08-07 ENCOUNTER — Ambulatory Visit: Payer: Self-pay | Admitting: Family Medicine

## 2014-08-18 ENCOUNTER — Emergency Department (HOSPITAL_COMMUNITY)
Admission: EM | Admit: 2014-08-18 | Discharge: 2014-08-18 | Disposition: A | Discharge status: Home / Self Care | Payer: Self-pay | Attending: Emergency Medicine | Admitting: Emergency Medicine

## 2014-08-18 ENCOUNTER — Encounter (HOSPITAL_COMMUNITY): Payer: Self-pay | Admitting: Family Medicine

## 2014-08-18 DIAGNOSIS — R2243 Localized swelling, mass and lump, lower limb, bilateral: Secondary | ICD-10-CM | POA: Insufficient documentation

## 2014-08-18 DIAGNOSIS — N3000 Acute cystitis without hematuria: Secondary | ICD-10-CM | POA: Insufficient documentation

## 2014-08-18 DIAGNOSIS — E785 Hyperlipidemia, unspecified: Secondary | ICD-10-CM | POA: Insufficient documentation

## 2014-08-18 DIAGNOSIS — G43909 Migraine, unspecified, not intractable, without status migrainosus: Secondary | ICD-10-CM | POA: Insufficient documentation

## 2014-08-18 DIAGNOSIS — R600 Localized edema: Secondary | ICD-10-CM

## 2014-08-18 DIAGNOSIS — Z7982 Long term (current) use of aspirin: Secondary | ICD-10-CM | POA: Insufficient documentation

## 2014-08-18 DIAGNOSIS — I10 Essential (primary) hypertension: Secondary | ICD-10-CM | POA: Insufficient documentation

## 2014-08-18 DIAGNOSIS — Z8781 Personal history of (healed) traumatic fracture: Secondary | ICD-10-CM | POA: Insufficient documentation

## 2014-08-18 DIAGNOSIS — I2510 Atherosclerotic heart disease of native coronary artery without angina pectoris: Secondary | ICD-10-CM | POA: Insufficient documentation

## 2014-08-18 DIAGNOSIS — Z720 Tobacco use: Secondary | ICD-10-CM | POA: Insufficient documentation

## 2014-08-18 DIAGNOSIS — Z79899 Other long term (current) drug therapy: Secondary | ICD-10-CM | POA: Insufficient documentation

## 2014-08-18 LAB — URINALYSIS, ROUTINE W REFLEX MICROSCOPIC
Bilirubin Urine: NEGATIVE
GLUCOSE, UA: NEGATIVE mg/dL
Ketones, ur: NEGATIVE mg/dL
Nitrite: POSITIVE — AB
PH: 5.5 (ref 5.0–8.0)
PROTEIN: NEGATIVE mg/dL
Specific Gravity, Urine: 1.008 (ref 1.005–1.030)
Urobilinogen, UA: 0.2 mg/dL (ref 0.0–1.0)

## 2014-08-18 LAB — COMPREHENSIVE METABOLIC PANEL
ALBUMIN: 3.7 g/dL (ref 3.5–5.2)
ALT: 18 U/L (ref 0–35)
AST: 15 U/L (ref 0–37)
Alkaline Phosphatase: 67 U/L (ref 39–117)
Anion gap: 6 (ref 5–15)
BUN: 9 mg/dL (ref 6–23)
CO2: 29 mmol/L (ref 19–32)
CREATININE: 0.85 mg/dL (ref 0.50–1.10)
Calcium: 9.4 mg/dL (ref 8.4–10.5)
Chloride: 105 mEq/L (ref 96–112)
GFR calc Af Amer: >90 mL/min (ref >90)
GFR, EST NON AFRICAN AMERICAN: 80 mL/min — AB (ref >90)
Glucose, Bld: 98 mg/dL (ref 70–99)
Potassium: 4.1 mmol/L (ref 3.5–5.1)
SODIUM: 140 mmol/L (ref 135–145)
Total Bilirubin: 0.7 mg/dL (ref 0.3–1.2)
Total Protein: 6.6 g/dL (ref 6.0–8.3)

## 2014-08-18 LAB — URINE MICROSCOPIC-ADD ON

## 2014-08-18 MED ORDER — NITROFURANTOIN MONOHYD MACRO 100 MG PO CAPS: 100.0000 mg | ORAL_CAPSULE | Freq: Two times a day (BID) | ORAL | Status: DC

## 2014-08-18 NOTE — Discharge Instructions (Signed)
Please read and follow all provided instructions.  Your diagnoses today include:  1. Bilateral edema of lower extremity   2. Acute cystitis without hematuria    Tests performed today include:  Liver and kidney function - are normal  Urine test - shows infection in urine, normal protein in urine  Vital signs. See below for your results today.   Medications prescribed:   Macrobid - antibiotic for urinary tract infection  You have been prescribed an antibiotic medicine: take the entire course of medicine even if you are feeling better. Stopping early can cause the antibiotic not to work.  Take any prescribed medications only as directed.  Home care instructions:  Follow any educational materials contained in this packet.  BE VERY CAREFUL not to take multiple medicines containing Tylenol (also called acetaminophen). Doing so can lead to an overdose which can damage your liver and cause liver failure and possibly death.   Follow-up instructions: Please follow-up with your primary care provider in the next 7 days for further evaluation of your symptoms.   Return instructions:   Please return to the Emergency Department if you experience worsening symptoms.   Return with fever, shortness of breath, or cough.   Please return if you have any other emergent concerns.  Additional Information:  Your vital signs today were: BP 101/54 mmHg   Pulse 68   Temp(Src) 98.3 F (36.8 C)   Resp 12   SpO2 98%   LMP 07/02/2013 If your blood pressure (BP) was elevated above 135/85 this visit, please have this repeated by your doctor within one month. --------------

## 2014-08-18 NOTE — ED Provider Notes (Signed)
CSN: [id number]     Arrival date & time 08/18/14  1014 History   First MD Initiated Contact with Patient 08/18/14 1236     Chief Complaint  Patient presents with  . Foot Swelling     (Consider location/radiation/quality/duration/timing/severity/associated sxs/prior Treatment) HPI Comments: Patient presents with complaint of 2 days of bilateral lower extremity swelling. She has not had this in the past. Patient noted mild swelling yesterday and then was much worse upon waking this morning. No redness or change in the color of the skin. Patient denies orthopnea, waking at night gasping for breath, history of congestive heart failure. She denies a history of liver or kidney problems. She states that yesterday she elevated her legs which improved her symptoms. She is on lisinopril for blood pressure, no calcium channel blockers. No chest pain or shortness of breath. No swelling around her eyes. No recent travel or immobilization. No history of blood clots.  The history is provided by the patient.    Past Medical History  Diagnosis Date  . Benign essential HTN 01/24/2012  . Tobacco abuse 01/26/2012    2ppd from 1998-2008. Stopped 2008-2011. Restarted after death of husband. As of 01/24/12, smoking 1ppd. Approximately 25 pack year history. Would like to quit.   . Coronary artery disease, non-occlusive 01/26/2012    Cardiac cath in 2009 performed by Dr. Virgel ManifoldJoseph Mobley [place] 40% stenosis of LAD proximal to 2nd diagonal. EF 55%  . Right hand fracture 2011  . Closed right ankle fracture 1979  . Hyperlipidemia LDL goal < 100 02/17/2012  . Migraine 05-10-12   Past Surgical History  Procedure Laterality Date  . Tonsillectomy and adenoidectomy  1970  . Nm myoview ltd  2011    normal perfusion, no iscemia, EF 54%  . Cardiac catheterization  2009    40% LAD stenosis at one junction; 40% stenosis if high obtuse marginal off l. circ  . Amaurosis  2011  . Cesarean section      2 previous   .  Cholecystectomy     Family History  Problem Relation Age of Onset  . Heart disease Mother 3645    First MI  . Hypertension Mother   . Kidney disease Mother   . Hyperlipidemia Mother   . Diabetes Mother   . Diabetes Father   . Hypertension Father   . Heart disease Maternal Grandmother   . Heart disease Maternal Grandfather   . Heart disease Paternal Grandmother   . Heart disease Paternal Grandfather    History  Substance Use Topics  . Smoking status: Current Every Day Smoker -- 0.50 packs/day for 20 years    Types: Cigarettes  . Smokeless tobacco: Never Used     Comment: 10/02/13-Per pt she is tring to quit--recently restarted (jar)  . Alcohol Use: No   OB History    Gravida Para Term Preterm AB TAB SAB Ectopic Multiple Living   3 2 2  1  1   2      Review of Systems  Constitutional: Negative for fever.  HENT: Negative for rhinorrhea and sore throat.   Eyes: Negative for redness.  Respiratory: Negative for cough and shortness of breath.   Cardiovascular: Positive for leg swelling. Negative for chest pain.  Gastrointestinal: Negative for nausea, vomiting, abdominal pain and diarrhea.  Genitourinary: Negative for dysuria.  Musculoskeletal: Negative for myalgias.  Skin: Negative for rash.  Neurological: Negative for headaches.    Allergies  Review of patient's allergies indicates no known  allergies.  Home Medications   Prior to Admission medications   Medication Sig Start Date End Date Taking? Authorizing Provider  aspirin EC 81 MG tablet Take 81 mg by mouth daily.   Yes Historical Provider, MD  CRANBERRY SOFT PO Take 2 tablets by mouth daily.   Yes Historical Provider, MD  Fish Oil-Cholecalciferol (FISH OIL/D3 ADULT GUMMIES) 184-250 MG-UNIT CHEW Chew 2 capsules by mouth daily.   Yes Historical Provider, MD  lisinopril (PRINIVIL,ZESTRIL) 20 MG tablet Take 1 tablet (20 mg total) by mouth daily. 07/11/14  Yes Kathee DeltonIan D McKeag, MD  Multiple Vitamin (MULTIVITAMIN) tablet Take 1  tablet by mouth daily.   Yes Historical Provider, MD  albuterol (PROVENTIL HFA;VENTOLIN HFA) 108 (90 BASE) MCG/ACT inhaler Inhale 1-2 puffs into the lungs every 6 (six) hours as needed for wheezing or shortness of breath. 05/08/14   Glynn OctaveStephen Rancour, MD  HYDROcodone-homatropine [facility](HYCODAN) 5-1.5 MG/5ML syrup Take 5 mLs by mouth every 6 (six) hours as needed for cough. Patient not taking: Reported on 08/18/2014 06/03/14   Tyrone Nineyan B Grunz, MD  lovastatin (MEVACOR) 40 MG tablet Take 1 tablet (40 mg total) by mouth at bedtime. Patient not taking: Reported on 08/18/2014 07/11/14   Kathee DeltonIan D McKeag, MD   BP 127/67 mmHg  Pulse 63  Temp(Src) 98.3 F (36.8 C)  Resp 13  SpO2 100%  LMP 07/02/2013   Physical Exam  Constitutional: She appears well-developed and well-nourished.  HENT:  Head: Normocephalic and atraumatic.  No periorbital edema.   Eyes: Conjunctivae are normal. Right eye exhibits no discharge. Left eye exhibits no discharge.  Neck: Normal range of motion. Neck supple.  Cardiovascular: Normal rate, regular rhythm and normal heart sounds.   Pulmonary/Chest: Effort normal and breath sounds normal.  Abdominal: Soft. There is no tenderness.  Musculoskeletal: She exhibits edema and tenderness.  1+ pitting edema, symmetric, to mid-calves. No cellulitis. No significant varicosities.   Neurological: She is alert.  Skin: Skin is warm and dry.  Psychiatric: She has a normal mood and affect.  Nursing note and vitals reviewed.   ED Course  Procedures (including critical care time) Labs Review Labs Reviewed  COMPREHENSIVE METABOLIC PANEL - Abnormal; Notable for the following:    GFR calc non Af Amer 80 (*)    All other components within normal limits  URINALYSIS, ROUTINE W REFLEX MICROSCOPIC - Abnormal; Notable for the following:    APPearance CLOUDY (*)    Hgb urine dipstick TRACE (*)    Nitrite POSITIVE (*)    Leukocytes, UA LARGE (*)    All other components within normal limits  URINE  MICROSCOPIC-ADD ON - Abnormal; Notable for the following:    Squamous Epithelial / LPF FEW (*)    Bacteria, UA MANY (*)    All other components within normal limits    Imaging Review No results found.   EKG Interpretation None       1:25 PM Patient seen and examined. Work-up initiated.   Vital signs reviewed and are as follows: BP 127/67 mmHg  Pulse 63  Temp(Src) 98.3 F (36.8 C)  Resp 13  SpO2 100%  LMP 07/02/2013  3:48 PM Patient informed of lab results.  Will treat urinary tract infection with Macrobid.  Encouraged patient to elevate her legs as much as possible, recommended wearing compression stockings and to follow-up with her PCP in the next week.  Patient encouraged to return with worsening shortness of breath, color change in her legs especially red which may indicate infection  or blood clot, worsening pain, other concerns. She verbalizes understanding and agrees with plan.  MDM   Final diagnoses:  Bilateral edema of lower extremity  Acute cystitis without hematuria   Lower extremity edema: No signs of nephrotic syndrome, liver problems. I cannot entirely rule out, but I doubt early congestive heart failure given her history. She is not have any chest pain or shortness of breath. She is not on any calcium channel blockers. I do not suspect DVT given symmetric bilateral symptoms and lack of risk factors. Feel patient is safe for discharge to home with PCP follow-up.  Acute cystitis: Asymptomatic, however 21-50 WBC with positive nitrite. Will treat. No concern for pyelo.    Renne Crigler, PA-C 08/18/14 1551  Merrie Roof, MD 08/18/14 [phone]

## 2014-08-18 NOTE — ED Notes (Signed)
Pt here with bilateral feet swelling. sts started yesterday.

## 2014-08-25 ENCOUNTER — Telehealth: Payer: Self-pay | Admitting: *Deleted

## 2014-08-25 ENCOUNTER — Ambulatory Visit (INDEPENDENT_AMBULATORY_CARE_PROVIDER_SITE_OTHER): Payer: Self-pay | Admitting: Family Medicine

## 2014-08-25 ENCOUNTER — Encounter: Payer: Self-pay | Admitting: Family Medicine

## 2014-08-25 VITALS — BP 130/73 | HR 63 | Temp 98.4°F | Ht 67.0 in | Wt 251.0 lb

## 2014-08-25 DIAGNOSIS — R0600 Dyspnea, unspecified: Secondary | ICD-10-CM

## 2014-08-25 DIAGNOSIS — R600 Localized edema: Secondary | ICD-10-CM

## 2014-08-25 DIAGNOSIS — E785 Hyperlipidemia, unspecified: Secondary | ICD-10-CM

## 2014-08-25 DIAGNOSIS — I2510 Atherosclerotic heart disease of native coronary artery without angina pectoris: Secondary | ICD-10-CM

## 2014-08-25 LAB — TSH: TSH: 2.179 u[IU]/mL (ref 0.350–4.500)

## 2014-08-25 MED ORDER — ATORVASTATIN CALCIUM 80 MG PO TABS
80.0000 mg | ORAL_TABLET | Freq: Every day | ORAL | Status: DC
Start: 2014-08-25 — End: 2014-08-30

## 2014-08-25 NOTE — Patient Instructions (Signed)
Edema Edema is an abnormal buildup of fluids in your bodytissues. Edema is somewhatdependent on gravity to pull the fluid to the lowest place in your body. That makes the condition more common in the legs and thighs (lower extremities). Painless swelling of the feet and ankles is common and becomes more likely as you get older. It is also common in looser tissues, like around your eyes.  When the affected area is squeezed, the fluid may move out of that spot and leave a dent for a few moments. This dent is called pitting.  CAUSES  There are many possible causes of edema. Eating too much salt and being on your feet or sitting for a long time can cause edema in your legs and ankles. Hot weather may make edema worse. Common medical causes of edema include:  Heart failure.  Liver disease.  Kidney disease.  Weak blood vessels in your legs.  Cancer.  An injury.  Pregnancy.  Some medications.  Obesity. SYMPTOMS  Edema is usually painless.Your skin may look swollen or shiny.  DIAGNOSIS  Your health care provider may be able to diagnose edema by asking about your medical history and doing a physical exam. You may need to have tests such as X-rays, an electrocardiogram, or blood tests to check for medical conditions that may cause edema.  TREATMENT  Edema treatment depends on the cause. If you have heart, liver, or kidney disease, you need the treatment appropriate for these conditions. General treatment may include:  Elevation of the affected body part above the level of your heart.  Compression of the affected body part. Pressure from elastic bandages or support stockings squeezes the tissues and forces fluid back into the blood vessels. This keeps fluid from entering the tissues.  Restriction of fluid and salt intake.  Use of a water pill (diuretic). These medications are appropriate only for some types of edema. They pull fluid out of your body and make you urinate more often. This  gets rid of fluid and reduces swelling, but diuretics can have side effects. Only use diuretics as directed by your health care provider. HOME CARE INSTRUCTIONS   Keep the affected body part above the level of your heart when you are lying down.   Do not sit still or stand for prolonged periods.   Do not put anything directly under your knees when lying down.  Do not wear constricting clothing or garters on your upper legs.   Exercise your legs to work the fluid back into your blood vessels. This may help the swelling go down.   Wear elastic bandages or support stockings to reduce ankle swelling as directed by your health care provider.   Eat a low-salt diet to reduce fluid if your health care provider recommends it.   Only take medicines as directed by your health care provider. SEEK MEDICAL CARE IF:   Your edema is not responding to treatment.  You have heart, liver, or kidney disease and notice symptoms of edema.  You have edema in your legs that does not improve after elevating them.   You have sudden and unexplained weight gain. SEEK IMMEDIATE MEDICAL CARE IF:   You develop shortness of breath or chest pain.   You cannot breathe when you lie down.  You develop pain, redness, or warmth in the swollen areas.   You have heart, liver, or kidney disease and suddenly get edema.  You have a fever and your symptoms suddenly get worse. MAKE SURE YOU:     Understand these instructions.  Will watch your condition.  Will get help right away if you are not doing well or get worse. Document Released: 08/08/2005 Document Revised: 12/23/2013 Document Reviewed: 05/31/2013 [facility] Patient Information 2015 [place], [place]. This information is not intended to replace advice given to you by your health care provider. Make sure you discuss any questions you have with your health care provider.  I have prescribed you some compression stockings, you can take this to a medical  supply store and they can help to you for them, there may be an out-of-pocket cost for you for these. I have ordered an echocardiogram of your heart to be completed as soon as they can schedule you. I will call you with results of your labs once they become available Please attempt to keep your legs elevated when at all possible. I have also placed a cardiology referral, for you to become established in the area with the cardiologist considering her extensive cardiac history. Considering your last lipids, I have raised the dosage on your statin medications.

## 2014-08-25 NOTE — Assessment & Plan Note (Signed)
Patient with bilateral lower extremity edema, trace to +1 today on exam, murmur, lungs are clear but patient reports dyspnea on exertion. She denied any chest pain. She has had a cardiac catheterization in [place], CAD. She's moved to the area within the last 2-3 years, and has not had a cardiologist in the area to follow-up with. - Considering history, and today's exam I have ordered an echocardiogram, BNP and TSH. She has had recent labs show no liver or kidney disease. She has elevated cholesterol on a statin, I have increased her statin today to atorvastatin 80 mg. She did report compliance. She does take an aspirin daily. She does smoke daily, she is attempting to quit and is currently smoking a half a pack per day. - I have prescribed compression stockings, and have asked the patient to keep her feet elevated when at all possible. - Smoking cessation reviewed. - Cardiology referral has been placed, for the acute issues and for her chronic issues that she will need followed up with. Follow-up in 2 weeks

## 2014-08-25 NOTE — Assessment & Plan Note (Addendum)
Patient with bilateral lower extremity edema, trace to +1 today on exam, murmur, lungs are clear but patient reports dyspnea on exertion. She denied any chest pain. She has had a cardiac catheterization in [place], CAD. She's moved to the area within the last 2-3 years, and has not had a cardiologist in the area to follow-up with. - Considering history, and today's exam I have ordered an echocardiogram, BNP and TSH. She has had recent labs show no liver or kidney disease. She has elevated cholesterol on a statin, I have increased her statin today to atorvastatin 80 mg. She did report compliance. She does take an aspirin daily. She does smoke daily, she is attempting to quit and is currently smoking a half a pack per day. - I have prescribed compression stockings, and have asked the patient to keep her feet elevated when at all possible. - Smoking cessation reviewed. - Cardiology referral has been placed, for the acute issues and for her chronic issues that she will need followed up with. Follow-up in 2 weeks 

## 2014-08-25 NOTE — Telephone Encounter (Signed)
Spoke with patient and informed her of Echo appointment set up for her. Tuesday 08/26/2014 @ 9:00am [place]. patient was instructed to go to 1st floor admitting where they will take patient from there. patient agreed to this appointment.

## 2014-08-25 NOTE — Progress Notes (Signed)
Subjective:    Patient ID: Holly Dunn, female    DOB: 21-Jul-1966, 49 y.o.   MRN: [id number]  HPI  Bilateral lower extremity swelling: Patient presents to same-day clinic today for bilateral lower extremity swelling of approximately 1 week. She was seen in the emergency room at that time, with the lab work that was pretty much unremarkable. No Signs of liver or kidney disease, by lab work. Patient has a history of cardiac catheterization approximately 3 years ago, without cardiology follow-up, for nonocclusive cardiac artery disease. Patient states that her lower extremity swelling has not improved since her emergency room visit. She has been attempting to keep her feet elevated when at all possible, and this seems to be helping. She does admit to dyspnea on exertion, and states when she has to walk from her car into her office building, she is short-winded.This is new for her, and has noticed an increase in her dyspnea since her legs have been swelling. She denies chest pain, orthopnea or palpitations. She is a every day smoker, she is attempting to quit and currently smokes a half a pack of cigarettes a day. She does take an aspirin and a statin medication. She is on lisinopril. Her last lipid panel was with elevated cholesterol and triglycerides, she states that she does take her statin medication daily.  Every day smoker Past Medical History  Diagnosis Date  . Benign essential HTN 02-10-12  . Tobacco abuse 01/26/2012    2ppd from 1998-2008. Stopped 2008-2011. Restarted after death of husband. As of 10-Feb-2012, smoking 1ppd. Approximately 25 pack year history. Would like to quit.   . Coronary artery disease, non-occlusive 01/26/2012    Cardiac cath in 2009 performed by Dr. Virgel Manifold in [place], [place]. 40% stenosis of LAD proximal to 2nd diagonal. EF 55%  . Right hand fracture 2011  . Closed right ankle fracture 1979  . Hyperlipidemia LDL goal < 100 02/17/2012  . Migraine 05-10-12   No Known  Allergies   Review of Systems Per HPI    Objective:   Physical Exam BP 130/73 mmHg  Pulse 63  Temp(Src) 98.4 F (36.9 C) (Oral)  Ht  (1.702 m)  Wt 251 lb (113.853 kg)  BMI 39.30 kg/m2  LMP 07/02/2013 Gen: Pleasant, Caucasian female, well-developed, well-nourished, alert, oriented, obese HEENT: AT. [place]. Bilateral eyes without injections or icterus. MMM.  CV: RRR, 2/6 systolic murmur appreciated. Chest: CTAB, no wheeze or crackles Abd: Soft. Obese NTND. BS present. No Masses or ascites palpated.  Ext: No erythema. Trace to +1 edema laterally. +2/4 PT Skin: no rashes, purpura or petechiae.  Neuro:  Normal gait.     Assessment & Plan:  Patient with bilateral lower extremity edema, trace to +1 today on exam, murmur, lungs are clear but patient reports dyspnea on exertion. She denied any chest pain. She has had a cardiac catheterization in [place], CAD. She's moved to the area within the last 2-3 years, and has not had a cardiologist in the area to follow-up with. - Considering history, and today's exam I have ordered an echocardiogram, BNP and TSH. She has had recent labs show no liver or kidney disease. She has elevated cholesterol on a statin, I have increased her statin today to atorvastatin 80 mg. She did report compliance. She does take an aspirin daily. She does smoke daily, she is attempting to quit and is currently smoking a half a pack per day. - I have prescribed compression stockings, and have  asked the patient to keep her feet elevated when at all possible. - Smoking cessation reviewed. - Cardiology referral has been placed, for the acute issues and for her chronic issues that she will need followed up with. Follow-up in 2 weeks

## 2014-08-26 ENCOUNTER — Telehealth: Payer: Self-pay | Admitting: Family Medicine

## 2014-08-26 ENCOUNTER — Ambulatory Visit (HOSPITAL_COMMUNITY)
Admission: RE | Admit: 2014-08-26 | Discharge: 2014-08-26 | Disposition: A | Discharge status: Home / Self Care | Payer: Self-pay | Source: Ambulatory Visit | Attending: Family Medicine | Admitting: Family Medicine

## 2014-08-26 DIAGNOSIS — I2510 Atherosclerotic heart disease of native coronary artery without angina pectoris: Secondary | ICD-10-CM | POA: Insufficient documentation

## 2014-08-26 DIAGNOSIS — R011 Cardiac murmur, unspecified: Secondary | ICD-10-CM

## 2014-08-26 DIAGNOSIS — Z8249 Family history of ischemic heart disease and other diseases of the circulatory system: Secondary | ICD-10-CM | POA: Insufficient documentation

## 2014-08-26 DIAGNOSIS — I10 Essential (primary) hypertension: Secondary | ICD-10-CM | POA: Insufficient documentation

## 2014-08-26 DIAGNOSIS — R0600 Dyspnea, unspecified: Secondary | ICD-10-CM | POA: Insufficient documentation

## 2014-08-26 DIAGNOSIS — E785 Hyperlipidemia, unspecified: Secondary | ICD-10-CM | POA: Insufficient documentation

## 2014-08-26 DIAGNOSIS — R0609 Other forms of dyspnea: Secondary | ICD-10-CM

## 2014-08-26 DIAGNOSIS — E669 Obesity, unspecified: Secondary | ICD-10-CM | POA: Insufficient documentation

## 2014-08-26 DIAGNOSIS — M25519 Pain in unspecified shoulder: Secondary | ICD-10-CM | POA: Insufficient documentation

## 2014-08-26 DIAGNOSIS — Z720 Tobacco use: Secondary | ICD-10-CM | POA: Insufficient documentation

## 2014-08-26 DIAGNOSIS — R600 Localized edema: Secondary | ICD-10-CM | POA: Insufficient documentation

## 2014-08-26 LAB — PRO B NATRIURETIC PEPTIDE: Pro B Natriuretic peptide (BNP): 24.68 pg/mL (ref <126)

## 2014-08-26 NOTE — Progress Notes (Signed)
  Echocardiogram 2D Echocardiogram has been performed.  Holly Dunn, Ray[place] 08/26/2014, 9:50 AM

## 2014-08-26 NOTE — Telephone Encounter (Signed)
Please call pt, her BNP and TSH labs we collected yesterday are normal. We will let her know her echo results once they are available. A cardiology referral has already been ordered for her. She should continue to elevate her legs, wear compression stockings and watch the sodium content in her diet. Thanks.

## 2014-08-27 ENCOUNTER — Encounter (HOSPITAL_COMMUNITY): Payer: Self-pay | Admitting: Family Medicine

## 2014-08-27 ENCOUNTER — Other Ambulatory Visit: Payer: Self-pay

## 2014-08-27 ENCOUNTER — Emergency Department (HOSPITAL_COMMUNITY): Payer: Self-pay

## 2014-08-27 ENCOUNTER — Telehealth: Payer: Self-pay | Admitting: Family Medicine

## 2014-08-27 ENCOUNTER — Inpatient Hospital Stay (HOSPITAL_COMMUNITY)
Admission: EM | Admit: 2014-08-27 | Discharge: 2014-08-30 | DRG: 247 | Disposition: A | Discharge status: Home / Self Care | Payer: Self-pay | Attending: Family Medicine | Admitting: Family Medicine

## 2014-08-27 DIAGNOSIS — M79609 Pain in unspecified limb: Secondary | ICD-10-CM

## 2014-08-27 DIAGNOSIS — I712 Thoracic aortic aneurysm, without rupture: Secondary | ICD-10-CM | POA: Diagnosis present

## 2014-08-27 DIAGNOSIS — Z720 Tobacco use: Secondary | ICD-10-CM | POA: Diagnosis present

## 2014-08-27 DIAGNOSIS — I200 Unstable angina: Secondary | ICD-10-CM | POA: Insufficient documentation

## 2014-08-27 DIAGNOSIS — K219 Gastro-esophageal reflux disease without esophagitis: Secondary | ICD-10-CM | POA: Diagnosis present

## 2014-08-27 DIAGNOSIS — R059 Cough, unspecified: Secondary | ICD-10-CM

## 2014-08-27 DIAGNOSIS — E785 Hyperlipidemia, unspecified: Secondary | ICD-10-CM | POA: Diagnosis present

## 2014-08-27 DIAGNOSIS — Z7951 Long term (current) use of inhaled steroids: Secondary | ICD-10-CM

## 2014-08-27 DIAGNOSIS — R05 Cough: Secondary | ICD-10-CM

## 2014-08-27 DIAGNOSIS — I350 Nonrheumatic aortic (valve) stenosis: Secondary | ICD-10-CM | POA: Diagnosis present

## 2014-08-27 DIAGNOSIS — F17210 Nicotine dependence, cigarettes, uncomplicated: Secondary | ICD-10-CM | POA: Diagnosis present

## 2014-08-27 DIAGNOSIS — I959 Hypotension, unspecified: Secondary | ICD-10-CM | POA: Diagnosis not present

## 2014-08-27 DIAGNOSIS — R600 Localized edema: Secondary | ICD-10-CM | POA: Diagnosis present

## 2014-08-27 DIAGNOSIS — I25110 Atherosclerotic heart disease of native coronary artery with unstable angina pectoris: Principal | ICD-10-CM | POA: Diagnosis present

## 2014-08-27 DIAGNOSIS — Z7982 Long term (current) use of aspirin: Secondary | ICD-10-CM

## 2014-08-27 DIAGNOSIS — I10 Essential (primary) hypertension: Secondary | ICD-10-CM | POA: Diagnosis present

## 2014-08-27 DIAGNOSIS — G4733 Obstructive sleep apnea (adult) (pediatric): Secondary | ICD-10-CM | POA: Diagnosis present

## 2014-08-27 DIAGNOSIS — R079 Chest pain, unspecified: Secondary | ICD-10-CM | POA: Diagnosis present

## 2014-08-27 DIAGNOSIS — I7121 Aneurysm of the ascending aorta, without rupture: Secondary | ICD-10-CM | POA: Diagnosis present

## 2014-08-27 DIAGNOSIS — R609 Edema, unspecified: Secondary | ICD-10-CM

## 2014-08-27 DIAGNOSIS — Z8249 Family history of ischemic heart disease and other diseases of the circulatory system: Secondary | ICD-10-CM

## 2014-08-27 HISTORY — DX: Gastro-esophageal reflux disease without esophagitis: K21.9

## 2014-08-27 HISTORY — DX: Unspecified osteoarthritis, unspecified site: M19.90

## 2014-08-27 HISTORY — DX: Depression, unspecified: F32.A

## 2014-08-27 HISTORY — DX: Major depressive disorder, single episode, unspecified: F32.9

## 2014-08-27 HISTORY — DX: Amaurosis fugax: G45.3

## 2014-08-27 HISTORY — DX: Sleep apnea, unspecified: G47.30

## 2014-08-27 HISTORY — DX: Cardiac murmur, unspecified: R01.1

## 2014-08-27 LAB — BASIC METABOLIC PANEL
Anion gap: 11 (ref 5–15)
BUN: 9 mg/dL (ref 6–23)
CO2: 26 mmol/L (ref 19–32)
CREATININE: 0.84 mg/dL (ref 0.50–1.10)
Calcium: 10.3 mg/dL (ref 8.4–10.5)
Chloride: 100 mEq/L (ref 96–112)
GFR calc non Af Amer: 81 mL/min — ABNORMAL LOW (ref >90)
GLUCOSE: 95 mg/dL (ref 70–99)
POTASSIUM: 4 mmol/L (ref 3.5–5.1)
Sodium: 137 mmol/L (ref 135–145)

## 2014-08-27 LAB — BRAIN NATRIURETIC PEPTIDE: B Natriuretic Peptide: 9.7 pg/mL (ref 0.0–100.0)

## 2014-08-27 LAB — I-STAT TROPONIN, ED: TROPONIN I, POC: 0 ng/mL (ref 0.00–0.08)

## 2014-08-27 LAB — TROPONIN I: Troponin I: <0.03 ng/mL (ref <0.031)

## 2014-08-27 LAB — CBC
HCT: 41.6 % (ref 36.0–46.0)
Hemoglobin: 14.2 g/dL (ref 12.0–15.0)
MCH: 34.1 pg — AB (ref 26.0–34.0)
MCHC: 34.1 g/dL (ref 30.0–36.0)
MCV: 99.8 fL (ref 78.0–100.0)
PLATELETS: 324 10*3/uL (ref 150–400)
RBC: 4.17 MIL/uL (ref 3.87–5.11)
RDW: 12.9 % (ref 11.5–15.5)
WBC: 7 10*3/uL (ref 4.0–10.5)

## 2014-08-27 MED ORDER — ASPIRIN 81 MG PO CHEW
324.0000 mg | CHEWABLE_TABLET | Freq: Once | ORAL | Status: AC
  Administered 2014-08-27: 324 mg via ORAL
  Filled 2014-08-27: qty 4

## 2014-08-27 MED ORDER — ALBUTEROL SULFATE (2.5 MG/3ML) 0.083% IN NEBU: 2.5000 mg | INHALATION_SOLUTION | Freq: Four times a day (QID) | RESPIRATORY_TRACT | Status: DC | PRN

## 2014-08-27 MED ORDER — HEPARIN SODIUM (PORCINE) 5000 UNIT/ML IJ SOLN
5000.0000 [IU] | Freq: Three times a day (TID) | INTRAMUSCULAR | Status: DC
  Administered 2014-08-27 – 2014-08-29 (×5): 5000 [IU] via SUBCUTANEOUS
  Filled 2014-08-27 (×10): qty 1

## 2014-08-27 MED ORDER — MORPHINE SULFATE 4 MG/ML IJ SOLN: 6.0000 mg | Freq: Once | INTRAMUSCULAR | Status: DC

## 2014-08-27 MED ORDER — ACETAMINOPHEN 325 MG PO TABS
650.0000 mg | ORAL_TABLET | ORAL | Status: DC | PRN
  Administered 2014-08-29: 22:00:00 650 mg via ORAL
  Filled 2014-08-27: qty 2

## 2014-08-27 MED ORDER — LISINOPRIL 20 MG PO TABS
20.0000 mg | ORAL_TABLET | Freq: Every day | ORAL | Status: DC
  Filled 2014-08-27: qty 1

## 2014-08-27 MED ORDER — ATORVASTATIN CALCIUM 80 MG PO TABS
80.0000 mg | ORAL_TABLET | Freq: Every day | ORAL | Status: DC
  Administered 2014-08-27 – 2014-08-29 (×3): 80 mg via ORAL
  Filled 2014-08-27 (×4): qty 1

## 2014-08-27 MED ORDER — ASPIRIN EC 81 MG PO TBEC
81.0000 mg | DELAYED_RELEASE_TABLET | Freq: Every day | ORAL | Status: DC
  Administered 2014-08-28 – 2014-08-30 (×2): 81 mg via ORAL
  Filled 2014-08-27 (×3): qty 1

## 2014-08-27 MED ORDER — FENTANYL CITRATE 0.05 MG/ML IJ SOLN
50.0000 ug | Freq: Once | INTRAMUSCULAR | Status: AC
  Administered 2014-08-27: 50 ug via INTRAVENOUS
  Filled 2014-08-27: qty 2

## 2014-08-27 MED ORDER — MORPHINE SULFATE 4 MG/ML IJ SOLN
6.0000 mg | Freq: Once | INTRAMUSCULAR | Status: AC
  Administered 2014-08-27: 6 mg via INTRAVENOUS
  Filled 2014-08-27: qty 2

## 2014-08-27 MED ORDER — SODIUM CHLORIDE 0.9 % IV BOLUS (SEPSIS)
1000.0000 mL | Freq: Once | INTRAVENOUS | Status: AC
  Administered 2014-08-27: 1000 mL via INTRAVENOUS

## 2014-08-27 MED ORDER — IOHEXOL 350 MG/ML SOLN
100.0000 mL | Freq: Once | INTRAVENOUS | Status: AC | PRN
  Administered 2014-08-27: 100 mL via INTRAVENOUS

## 2014-08-27 MED ORDER — SODIUM CHLORIDE 0.9 % IV SOLN
INTRAVENOUS | Status: DC
  Administered 2014-08-27: 19:00:00 via INTRAVENOUS

## 2014-08-27 MED ORDER — ONDANSETRON HCL 4 MG/2ML IJ SOLN: 4.0000 mg | Freq: Four times a day (QID) | INTRAMUSCULAR | Status: DC | PRN

## 2014-08-27 MED ORDER — MORPHINE SULFATE 2 MG/ML IJ SOLN: 2.0000 mg | INTRAMUSCULAR | Status: DC | PRN

## 2014-08-27 MED ORDER — IOHEXOL 300 MG/ML  SOLN: 100.0000 mL | Freq: Once | INTRAMUSCULAR | Status: DC | PRN

## 2014-08-27 MED ORDER — ONDANSETRON HCL 4 MG/2ML IJ SOLN
4.0000 mg | Freq: Once | INTRAMUSCULAR | Status: AC
  Administered 2014-08-27: 4 mg via INTRAVENOUS
  Filled 2014-08-27: qty 2

## 2014-08-27 MED ORDER — GI COCKTAIL ~~LOC~~
30.0000 mL | Freq: Four times a day (QID) | ORAL | Status: DC | PRN
  Filled 2014-08-27: qty 30

## 2014-08-27 NOTE — ED Notes (Signed)
Patient transported to vascular. 

## 2014-08-27 NOTE — Telephone Encounter (Signed)
Please call pt and inform her that her echo was reassuring. I do not believe her leg swelling is coming from her decreased function of her heart. I want her to follow up with cardiology, for her dyspnea on exertion since she has a history of coronary artery disease and cath prior. They may need to perform a stress test on her. Referral had be been placed during our OV. She should wear her compression stockings daily and elevate  her feet when possible. If after doing these things her ankles are still swollen she should follow up with her primary. Thanks.

## 2014-08-27 NOTE — Progress Notes (Signed)
*  PRELIMINARY RESULTS* Vascular Ultrasound Lower extremity venous duplex has been completed.  Preliminary findings: no evidence of DVT   Farrel DemarkJill Eunice, RDMS, RVT  08/27/2014, 3:10 PM

## 2014-08-27 NOTE — H&P (Signed)
Family Medicine Teaching [facility] Admission History and Physical Service Pager: [phone]  Patient name: Holly Dunn Medical record number: [id number] Date of birth: Aug 10, 1966 Age: 49 y.o. Gender: female  Primary Care Provider: Wende MottMcKeag, Janine OresIan D, MD Consultants: none Code Status: Full  Chief Complaint: chest pain  Assessment and Plan: Holly Dunn is a 49 y.o. female presenting with chest pain x 1 day. PMH is significant for known CAD, HTN, HLD, tobacco use.  # Chest pain: known CAD history. Vitals stable. Not typical anginal pain, worse with deep inspiration may represent costochondritis/MSK, pleuritis. PE ruled out with CTA chest, also less likely to have pneumonia with this result. CXR normal. EKG showed NSR and persistent TWI in I and aVL slightly deeper than in September. BMP, CBC normal, BNP 9.7, istat troponin negative. She did have an echo done yesterday with EF 50-55% and grade 1 diastolic dysfunction. HEART score 2, but with known CAD and recent outpt referral to cards for further evaluation. - admit for obs, CP r/o - cycle troponins - morphine 2mg  q2hr PRN - repeat BMP, CBC, EKG, CXR in AM - daily ASA - ask cardiology to eval tomorrow (note that referral placed at office visit 1/4)  # Thoracic aortic aneurysm: Seen on CTA chest, 4.0cm in ascending thoracic aorta. Low suspicion as cause of CP; no evidence of rupture.  - outpt follow up with annual screening imaging. - tobacco cessation counseling  # Recent UTI: should be finished with macrobid course - repeat UA if complaining of burning/pain with voids  # HTN: normotensive in ED - continue home lisinopril  # HLD: - continue home atorva (recently increased to 80mg )  # Tobacco use - tobacco cessation counseling done in ED  # Living situation: recently evicted,  - will ask SW to see if any resources can be provided  FEN/GI: diet heart healthy / saline lock. Prophylaxis: hep sq  Disposition: admit to  tele  History of Present Illness: Holly Dunn is a 49 y.o. female presenting with 1 day history of CP. PMH significant for CAD with last heart cath 2009, HTN, HLD, tobacco use. Chest pain developed this morning while sitting at a computer. Episode described as sharp, middle of her chest, also feels like she was "punched". The pain did not radiate and lasted for about 15-20 minutes. She did note to be slightly nauseas before the pain started. Pain was worse with laying flat (noted in ED) and deep breaths. The pain went away on its own and came back about 45 minutes later while driving in her car; the pain stayed with her and she decided to go to the ED. She says that she is unable to lay completely on her back, and lays on her side while in the ED, she does have shortness of breath and a chronic cough that is not worse than normal. At home she sleeps with 2 pillows. She has had worsened bilateral lower extremity swelling for which she was seen in the office 2 days ago and had an echo done yesterday.  She also was recently treated for her second UTI, and was just finishing her course of macrobid. She is not established with a cardiologist.   Review Of Systems: Per HPI with the following additions: none Otherwise 12 point review of systems was performed and was unremarkable.  Patient Active Problem List   Diagnosis Date Noted  . Edema, lower extremity 08/25/2014  . Dyspnea 08/25/2014  . Urinary tract infectious disease 07/30/2014  .  Subcutaneous cyst 07/11/2014  . Viral URI with cough 06/03/2014  . Headache 04/29/2014  . Rash of back 12/18/2013  . Burning sensation of feet 05/23/2013  . Heart murmur, systolic 08/01/2012  . Subcutaneous nodule of chest wall 08/01/2012  . Dry cough 07/27/2012  . Migraine headache 05/10/2012  . Hyperlipidemia LDL goal <100 02/17/2012  . Shoulder pain, acute 02/17/2012  . Corneal dystrophy 01/26/2012  . Obesity (BMI 30-39.9) 01/26/2012  . Tobacco abuse  01/26/2012  . Coronary artery disease, non-occlusive 01/26/2012  . Non-cardiac chest pain 01/26/2012  . Irregular menses 01/26/2012  . 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 Benign essential HTN Feb 09, 2012   Past Medical History: Past Medical History  Diagnosis Date  . Benign essential HTN 02-09-12  . Tobacco abuse 01/26/2012    2ppd from 1998-2008. Stopped 2008-2011. Restarted after death of husband. As of 2012/02/09, smoking 1ppd. Approximately 25 pack year history. Would like to quit.   . Coronary artery disease, non-occlusive 01/26/2012    Cardiac cath in 2009 performed by Dr. Virgel Manifold in [place], [place]. 40% stenosis of LAD proximal to 2nd diagonal. EF 55%  . Right hand fracture 2011  . Closed right ankle fracture 1979  . Hyperlipidemia LDL goal < 100 02/17/2012  . Migraine 05-10-12   Past Surgical History: Past Surgical History  Procedure Laterality Date  . Tonsillectomy and adenoidectomy  1970  . Nm myoview ltd  2011    normal perfusion, no iscemia, EF 54%  . Cardiac catheterization  2009    40% LAD stenosis at one junction; 40% stenosis if high obtuse marginal off l. circ  . Amaurosis  2011  . Cesarean section      2 previous   . Cholecystectomy     Social History: History  Substance Use Topics  . Smoking status: Current Every Day Smoker -- 0.50 packs/day for 20 years    Types: Cigarettes  . Smokeless tobacco: Never Used     Comment: 10/02/13-Per pt she is tring to quit--recently restarted (jar)  . Alcohol Use: No   Additional social history: recently evicted from her home and has been moving around at friends, motels. Lives alone. Smoker but wants to quit and joined support group 2 weeks before christmas.   Please also refer to relevant sections of EMR.  Family History: Family History  Problem Relation Age of Onset  . Heart disease Mother 62    First MI  . Hypertension Mother   . Kidney disease Mother   . Hyperlipidemia Mother   . Diabetes Mother   .  Diabetes Father   . Hypertension Father   . Heart disease Maternal Grandmother   . Heart disease Maternal Grandfather   . Heart disease Paternal Grandmother   . Heart disease Paternal Grandfather    Allergies and Medications: No Known Allergies No current facility-administered medications on file prior to encounter.   Current Outpatient Prescriptions on File Prior to Encounter  Medication Sig Dispense Refill  . albuterol (PROVENTIL HFA;VENTOLIN HFA) 108 (90 BASE) MCG/ACT inhaler Inhale 1-2 puffs into the lungs every 6 (six) hours as needed for wheezing or shortness of breath. 1 Inhaler 0  . aspirin EC 81 MG tablet Take 81 mg by mouth daily.    . Fish Oil-Cholecalciferol (FISH OIL/D3 ADULT GUMMIES) 184-250 MG-UNIT CHEW Chew 2 capsules by mouth daily.    Marland Kitchen lisinopril (PRINIVIL,ZESTRIL) 20 MG tablet Take 1 tablet (20 mg total) by mouth daily. 30 tablet 5  . Multiple Vitamin (MULTIVITAMIN) tablet  Take 1 tablet by mouth daily.    Marland Kitchen atorvastatin (LIPITOR) 80 MG tablet Take 1 tablet (80 mg total) by mouth daily. (Patient not taking: Reported on 08/27/2014) 30 tablet 11  . HYDROcodone-homatropine (HYCODAN) 5-1.5 MG/5ML syrup Take 5 mLs by mouth every 6 (six) hours as needed for cough. (Patient not taking: Reported on 08/18/2014) 120 mL 0  . nitrofurantoin, macrocrystal-monohydrate, (MACROBID) 100 MG capsule Take 1 capsule (100 mg total) by mouth 2 (two) times daily. (Patient not taking: Reported on 08/27/2014) 10 capsule 0    Objective: BP 99/59 mmHg  Pulse 69  Temp(Src) 98 F (36.7 C) (Oral)  Resp 15  SpO2 97%  LMP 07/02/2013 Exam: General: NAD, laying on right side, normal conversant HEENT: PERRL, EOMI, MMM. Cardiovascular: RRR, normal s1 and s2, no murmurs appreciated. 2+ radial pulses bilaterally Respiratory: clear bilaterally, normal WOB Abdomen: soft, obese, nontender, nondistended, normal bowel sounds Extremities: 1-2+ edema bilaterally Skin: no rashes Neuro: alert and oriented, no  focal deficits  Labs and Imaging: CBC BMET   Recent Labs Lab 08/27/14 1240  WBC 7.0  HGB 14.2  HCT 41.6  PLT 324    Recent Labs Lab 08/27/14 1240  NA 137  K 4.0  CL 100  CO2 26  BUN 9  CREATININE 0.84  GLUCOSE 95  CALCIUM 10.3     Dg Chest 2 View  08/27/2014   CLINICAL DATA:  Cough for 3 weeks.  Mid chest pain.  EXAM: CHEST  2 VIEW  COMPARISON:  05/08/2014  FINDINGS: The heart size and mediastinal contours are within normal limits. Both lungs are clear. The visualized skeletal structures are unremarkable.  IMPRESSION: No active cardiopulmonary disease.   Electronically Signed   By: Myles Rosenthal M.D.   On: 08/27/2014 13:49   Ct Angio Chest Pe W/cm &/or Wo Cm  08/27/2014   CLINICAL DATA:  Central chest pain with shortness of breath and nausea while driving to work.  EXAM: CT ANGIOGRAPHY CHEST WITH CONTRAST  TECHNIQUE: Multidetector CT imaging of the chest was performed using the standard protocol during bolus administration of intravenous contrast. Multiplanar CT image reconstructions and MIPs were obtained to evaluate the vascular anatomy.  CONTRAST:  1 OMNIPAQUE IOHEXOL 300 MG/ML SOLN, OMNIPAQUE IOHEXOL 350 MG/ML SOLN  COMPARISON:  08/27/2014 chest radiograph  FINDINGS: No filling defect is identified in the pulmonary arterial tree to suggest pulmonary embolus.  Ascending thoracic aortic diameter 4.0 cm. Mild atherosclerotic calcification in the aortic arch. Left anterior descending coronary artery atherosclerotic calcification. Borderline enlargement of the left ventricle.  No pathologic thoracic adenopathy.  Mild central airway thickening noted.  Otherwise lungs appear clear.  Review of the MIP images confirms the above findings.  IMPRESSION: 1. No filling defect is identified in the pulmonary arterial tree to suggest pulmonary embolus. 2. Airway thickening is present, suggesting bronchitis or reactive airways disease. 3. Coronary artery atherosclerosis. 4. 4.0 cm in diameter  ascending thoracic aortic aneurysm. Recommend annual imaging followup by CTA or MRA. This recommendation follows 2010 ACCF/AHA/AATS/ACR/ASA/SCA/SCAI/SIR/STS/SVM Guidelines for the Diagnosis and Management of Patients With Thoracic Aortic Disease. Circulation. 2010; 121: R604-V409   Electronically Signed   By: Herbie Baltimore M.D.   On: 08/27/2014 15:06     Nani Ravens, MD 08/27/2014, 3:56 PM PGY-2,  Family Medicine FPTS Intern pager: [phone], text pages welcome

## 2014-08-27 NOTE — ED Provider Notes (Signed)
Patient presented to the ER with chest pain. Patient having chest pressure and sharp pain in her chest today. Patient does have a history of nonobstructive coronary artery disease seen in 2009 and cardiac cath. She has not been studied since then. She does have a history of multiple cardiac risk factors.   Face to face Exam: HEENT - PERRLA Lungs - CTAB Heart - RRR, no M/R/G Abd - S/NT/ND Neuro - alert, oriented x3  Plan: Initial workup unremarkable. He will require hospitalization for further evaluation of chest pain.   Gilda Creasehristopher J. Sheyenne Konz, MD 08/27/14 1550

## 2014-08-27 NOTE — ED Provider Notes (Signed)
CSN: [id number]     Arrival date & time 08/27/14  1210 History   First MD Initiated Contact with Patient 08/27/14 1219     Chief Complaint  Patient presents with  . Chest Pain  . Shortness of Breath     (Consider location/radiation/quality/duration/timing/severity/associated sxs/prior Treatment) HPI   Pt with hx HTN, CAD, HL, p/w sharp central chest pain and pressure that occurred once at 11:30am for 10 minutes, then occurred again and has been constant since.  The pain radiates into her left neck.  The pain is worse with deep inspiration.  Associated nausea, SOB.  Has had leg swelling bilaterally with lower extremity pain for the past 1 week.  Has had orthopnea that is unchanged x years.  Denies dizziness, lightheadedness. Denies fevers, cough (or change from chronic smoker's cough), URI symptoms, hemoptysis.  Is just finishing antibiotics to treat UTI - no currently UTI symptoms.  Denies recent immobilization, exogenous estrogen, family or personal hx blood clots.   Had echo performed yesterday that was reassuring.   [facility], per notes found 40% stenosis in the LAD.  Past Medical History  Diagnosis Date  . Benign essential HTN 01/24/2012  . Tobacco abuse 01/26/2012    2ppd from 1998-2008. Stopped 2008-2011. Restarted after death of husband. As of 01/24/12, smoking 1ppd. Approximately 25 pack year history. Would like to quit.   . Coronary artery disease, non-occlusive 01/26/2012    Cardiac cath in 2009 performed [place]. Virgel Manifold[place] Mobley [place] 40% stenosis of LAD proximal to 2nd diagonal. EF 55%  . Right hand fracture 2011  . Closed right ankle fracture 1979  . Hyperlipidemia LDL goal < 100 02/17/2012  . Migraine 05-10-12   Past Surgical History  Procedure Laterality Date  . Tonsillectomy and adenoidectomy  1970  . Nm myoview ltd  2011    normal perfusion, no iscemia, EF 54%  . Cardiac catheterization  2009    40% LAD stenosis at one junction; 40% stenosis if high  obtuse marginal off l. circ  . Amaurosis  2011  . Cesarean section      2 previous   . Cholecystectomy     Family History  Problem Relation Age of Onset  . Heart disease Mother [id number]    First MI  . Hypertension Mother   . Kidney disease Mother   . Hyperlipidemia Mother   . Diabetes Mother   . Diabetes Father   . Hypertension Father   . Heart disease Maternal Grandmother   . Heart disease Maternal Grandfather   . Heart disease Paternal Grandmother   . Heart disease Paternal Grandfather    History  Substance Use Topics  . Smoking status: Current Every Day Smoker -- 0.50 packs/day for 20 years    Types: Cigarettes  . Smokeless tobacco: Never Used     Comment: 10/02/13-Per pt she is tring to quit--recently restarted (jar)  . Alcohol Use: No   OB History    Gravida Para Term Preterm AB TAB SAB Ectopic Multiple Living   3 2 2  1  1   2      Review of Systems  All other systems reviewed and are negative.     Allergies  Review of patient's allergies indicates no known allergies.  Home Medications   Prior to Admission medications   Medication Sig Start Date End Date Taking? Authorizing Provider  albuterol (PROVENTIL HFA;VENTOLIN HFA) 108 (90 BASE) MCG/ACT inhaler Inhale 1-2 puffs into the lungs every  6 (six) hours as needed for wheezing or shortness of breath. 05/08/14  Yes Glynn Octave, MD  aspirin EC 81 MG tablet Take 81 mg by mouth daily.   Yes Historical Provider, MD  Fish Oil-Cholecalciferol (FISH OIL/D3 ADULT GUMMIES) 184-250 MG-UNIT CHEW Chew 2 capsules by mouth daily.   Yes Historical Provider, MD  lisinopril (PRINIVIL,ZESTRIL) 20 MG tablet Take 1 tablet (20 mg total) by mouth daily. 07/11/14  Yes Kathee Delton, MD  Multiple Vitamin (MULTIVITAMIN) tablet Take 1 tablet by mouth daily.   Yes Historical Provider, MD  rosuvastatin (CRESTOR) 40 MG tablet Take 40 mg by mouth daily.   Yes Historical Provider, MD  atorvastatin (LIPITOR) 80 MG tablet Take 1 tablet (80 mg total)  by mouth daily. Patient not taking: Reported on 08/27/2014 08/25/14   Renee A Kuneff, DO  HYDROcodone-homatropine (HYCODAN) 5-1.5 MG/5ML syrup Take 5 mLs by mouth every 6 (six) hours as needed for cough. Patient not taking: Reported on 08/18/2014 06/03/14   Tyrone Nine, MD  nitrofurantoin, macrocrystal-monohydrate, (MACROBID) 100 MG capsule Take 1 capsule (100 mg total) by mouth 2 (two) times daily. Patient not taking: Reported on 08/27/2014 08/18/14   Renne Crigler, PA-C   BP 110/67 mmHg  Pulse 84  Temp(Src) 97.2 F (36.2 C)  Resp 18  SpO2 100%  LMP 07/02/2013 Physical Exam  Constitutional: She appears well-developed and well-nourished. No distress.  HENT:  Head: Normocephalic and atraumatic.  Neck: Neck supple.  Cardiovascular: Normal rate and regular rhythm.   Pulmonary/Chest: Effort normal and breath sounds normal. No respiratory distress. She has no wheezes. She has no rales.  Abdominal: Soft. She exhibits no distension. There is no tenderness. There is no rebound and no guarding.  Musculoskeletal: She exhibits edema (bilateral peripheral edema).  Bilateral calf tenderness  Neurological: She is alert.  Skin: She is not diaphoretic.  Nursing note and vitals reviewed.   ED Course  Procedures (including critical care time) Labs Review Labs Reviewed  CBC - Abnormal; Notable for the following:    MCH 34.1 (*)    All other components within normal limits  BASIC METABOLIC PANEL - Abnormal; Notable for the following:    GFR calc non Af Amer 81 (*)    All other components within normal limits  BRAIN NATRIURETIC PEPTIDE  I-STAT TROPOININ, ED    Imaging Review Dg Chest 2 View  08/27/2014   CLINICAL DATA:  Cough for 3 weeks.  Mid chest pain.  EXAM: CHEST  2 VIEW  COMPARISON:  05/08/2014  FINDINGS: The heart size and mediastinal contours are within normal limits. Both lungs are clear. The visualized skeletal structures are unremarkable.  IMPRESSION: No active cardiopulmonary disease.    Electronically Signed   By: Myles Rosenthal M.D.   On: 08/27/2014 13:49   Ct Angio Chest Pe W/cm &/or Wo Cm  08/27/2014   CLINICAL DATA:  Central chest pain with shortness of breath and nausea while driving to work.  EXAM: CT ANGIOGRAPHY CHEST WITH CONTRAST  TECHNIQUE: Multidetector CT imaging of the chest was performed using the standard protocol during bolus administration of intravenous contrast. Multiplanar CT image reconstructions and MIPs were obtained to evaluate the vascular anatomy.  CONTRAST:  1 OMNIPAQUE IOHEXOL 300 MG/ML SOLN, OMNIPAQUE IOHEXOL 350 MG/ML SOLN  COMPARISON:  08/27/2014 chest radiograph  FINDINGS: No filling defect is identified in the pulmonary arterial tree to suggest pulmonary embolus.  Ascending thoracic aortic diameter 4.0 cm. Mild atherosclerotic calcification in the aortic arch. Left  anterior descending coronary artery atherosclerotic calcification. Borderline enlargement of the left ventricle.  No pathologic thoracic adenopathy.  Mild central airway thickening noted.  Otherwise lungs appear clear.  Review of the MIP images confirms the above findings.  IMPRESSION: 1. No filling defect is identified in the pulmonary arterial tree to suggest pulmonary embolus. 2. Airway thickening is present, suggesting bronchitis or reactive airways disease. 3. Coronary artery atherosclerosis. 4. 4.0 cm in diameter ascending thoracic aortic aneurysm. Recommend annual imaging followup by CTA or MRA. This recommendation follows 2010 ACCF/AHA/AATS/ACR/ASA/SCA/SCAI/SIR/STS/SVM Guidelines for the Diagnosis and Management of Patients With Thoracic Aortic Disease. Circulation. 2010; 121: Z610-R604   Electronically Signed   By: Herbie Baltimore M.D.   On: 08/27/2014 15:06     EKG Interpretation None        Date: 08/27/2014  Rate: 102  Rhythm: sinus tachycardia  QRS Axis: normal  Intervals: normal  ST/T Wave abnormalities: nonspecific T wave changes  Conduction Disutrbances:none  Narrative  Interpretation:   Old EKG Reviewed: unchanged     3:44 PM Discussed pt with Dr Blinda Leatherwood who will also see patient.   Bilateral DVT studies negative.   MDM   Final diagnoses:  Chest pain  Thoracic ascending aortic aneurysm    Pt with hx HTN, HLD, CAD (known 40% stenosis LAD, cath 2009) p/w sharp/pressure-like central chest pain with associated SOB and nausea.  Two episodes, one lasting 10 minutes and second that is ongoing.  Ruled out for PE in ED.  Continued pain and episode of low blood pressure - pain medication held and IVF given.  Found to have ascending thoracic aneurysm 4 cm.  Admitted to [facility], patient's PCP for continued monitoring for chest pain.     Trixie Dredge, PA-C 08/27/14 1606  Gilda Crease, MD 08/27/14 1626

## 2014-08-27 NOTE — ED Notes (Signed)
Per pt sts she was driving to work and began having central chest pain with SOB and nausea. sts cough.

## 2014-08-27 NOTE — Telephone Encounter (Signed)
LVM for patient to call back. ?

## 2014-08-27 NOTE — Telephone Encounter (Signed)
Error

## 2014-08-27 NOTE — ED Notes (Signed)
Patient transported to X-ray 

## 2014-08-27 NOTE — Telephone Encounter (Signed)
LVM for patient to call back to inform her of below 

## 2014-08-28 DIAGNOSIS — Z720 Tobacco use: Secondary | ICD-10-CM

## 2014-08-28 DIAGNOSIS — E785 Hyperlipidemia, unspecified: Secondary | ICD-10-CM

## 2014-08-28 DIAGNOSIS — I200 Unstable angina: Secondary | ICD-10-CM

## 2014-08-28 DIAGNOSIS — I712 Thoracic aortic aneurysm, without rupture: Secondary | ICD-10-CM

## 2014-08-28 LAB — TROPONIN I: Troponin I: <0.03 ng/mL (ref <0.031)

## 2014-08-28 MED ORDER — SODIUM CHLORIDE 0.9 % IV BOLUS (SEPSIS)
500.0000 mL | Freq: Once | INTRAVENOUS | Status: AC
  Administered 2014-08-28: 500 mL via INTRAVENOUS

## 2014-08-28 MED ORDER — SODIUM CHLORIDE 0.9 % IV SOLN
INTRAVENOUS | Status: DC
  Administered 2014-08-28: 02:00:00 via INTRAVENOUS

## 2014-08-28 MED ORDER — PANTOPRAZOLE SODIUM 40 MG PO TBEC
40.0000 mg | DELAYED_RELEASE_TABLET | Freq: Every day | ORAL | Status: DC
  Administered 2014-08-28 – 2014-08-30 (×3): 40 mg via ORAL
  Filled 2014-08-28 (×2): qty 1

## 2014-08-28 NOTE — Progress Notes (Signed)
   08/28/14 1500  Clinical Encounter Type  Visited With Patient  Visit Type Initial;Spiritual support;Social support  Referral From Nurse  Spiritual Encounters  Spiritual Needs Emotional  Stress Factors  Patient Stress Factors Financial concerns;Health changes   Chaplain was referred to patient via spiritual care consult. Patient seemed to be really tired today. Patient described her recent struggles. Patient explained that she is having a cath procedure tomorrow and while she is nervous is hoping it will help her feel better. Patient also recently became homeless a few weeks before Christmas due to an eviction. Patient has been utilizing community resources such [facility] [facility] and the [employer]. Patient is still employed and has confidence that she will be able to get back into a place of her own again. Patient said that some of her family lives in [place] but that all of this family lives in one house together. Chaplain will continue to provide emotional and spiritual support for patient as needed. Jenniferlynn Saad, Tommi EmeryBlake R, Chaplain  3:50 PM

## 2014-08-28 NOTE — Telephone Encounter (Signed)
Spoke with patient and informed her of below 

## 2014-08-28 NOTE — Progress Notes (Signed)
Pt manual B/P 96/72. Pt complained of dizziness during ambulation to the bathroom. RN paged MD. Orders given for 500 cc bolus and rate increase of normal saline from 75 to 100. Pt currently resting in bed with call bell within reach. Will continue to monitor.  Meisha Salone, Dot Beenreama R, RN

## 2014-08-28 NOTE — Clinical Social Work Note (Signed)
CSW spoke with patient at bedside.  Patient states that she was evicted 2 weeks before Christmas.  Patient has been homeless ever since staying with friends, shelters, and in her car.  Patient works full time in [place] and is attempting to find housing in that area.    CSW will continue to follow and help patient find resources.  Merlyn LotJenna Holoman, LCSWA Clinical Social Worker [phone]

## 2014-08-28 NOTE — Discharge Summary (Signed)
Family Medicine [facility]  Patient name: Holly Dunn Medical record number: [id number] Date of birth: 1966-01-11 Age: 49 y.o. Gender: female Date of Admission: 08/27/2014  Date of Discharge: 08/30/2014  Admitting Physician: Sanjuana LettersWilliam Arthur Hensel, MD  Primary Care Provider: Wende MottMcKeag, Janine OresIan D, MD Consultants: Cards  Indication for Hospitalization: Chest pain  Discharge Diagnoses/Problem List:   Patient Active Problem List   Diagnosis Date Noted  . Unstable angina   . Chest pain 08/27/2014  . Thoracic ascending aortic aneurysm 08/27/2014  . Bilateral edema of lower extremity 08/27/2014  . Edema, lower extremity 08/25/2014  . Dyspnea 08/25/2014  . Urinary tract infectious disease 07/30/2014  . Subcutaneous cyst 07/11/2014  . Viral URI with cough 06/03/2014  . Headache 04/29/2014  . Rash of back 12/18/2013  . Burning sensation of feet 05/23/2013  . Heart murmur, systolic 08/01/2012  . Subcutaneous nodule of chest wall 08/01/2012  . Dry cough 07/27/2012  . Migraine headache 05/10/2012  . Hyperlipidemia LDL goal <100 02/17/2012  . Shoulder pain, acute 02/17/2012  . Corneal dystrophy 01/26/2012  . Obesity (BMI 30-39.9) 01/26/2012  . Tobacco abuse 01/26/2012  . Coronary artery disease, non-occlusive 01/26/2012  . Non-cardiac chest pain 01/26/2012  . Irregular menses 01/26/2012  . 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 Benign essential HTN 01/24/2012    Disposition: Home  Discharge Condition: Improved  Discharge Exam:  Blood pressure 94/58, pulse 70, temperature 98.2 F (36.8 C), temperature source Oral, resp. rate 16, height 5\' 6"  (1.676 m), weight 250 lb (113.399 kg), last menstrual period 07/02/2013, SpO2 100 %.  General: NAD, sitting in bed, normal conversant HEENT: PERRL, EOMI, MMM. Cardiovascular: RRR, normal s1 and s2, no murmurs appreciated. 2+ radial pulses bilaterally Respiratory: clear bilaterally, normal WOB Abdomen: soft,  obese, nontender, nondistended, normal bowel sounds Extremities: 1+ edema bilaterally Neuro: alert and oriented, no focal deficits   Brief Hospital Course:  Holly Dunn is a 49 y.o. female presenting with chest pain x 1 day. PMH is significant for known CAD, HTN, HLD, tobacco use.  #Chest pain: She has known CAD history. Not typical anginal pain, worse with deep inspiration may represent costochondritis/MSK, pleuritis. Patient was tender to palpation over lower sternum and has hx of reflux. PE ruled out with CTA chest, also less likely to have pneumonia with this result. CXR normal. EKG showed NSR and persistent TWI in I and aVL slightly deeper than in September. BMP, CBC normal, BNP 9.7, troponins negative. She did have an echo done yesterday with EF 50-55% and grade 1 diastolic dysfunction. HEART score 2. Since patient with known CAD and recent outpatient referral to cards we consulted cardiology inpatient. Cardiology took to L heart cath on 1/8 that showed 70% LAD stenosis. Placed DES in LAD and recommended plavix + ASA for 1 yearACS r/o with negative troponins and unchanged, nonischemic EKG. She remained on a daily aspirin and was placed on Plavix after PCI with DES. We held patients BP medications due to her becoming hypotensive, but patient slightly hypertensive O/N prior to discharge, so home lisinopril resumed at discharge. Symptoms resolved at time of discharge.   Of note, CTA reveled a thoracic aortic aneurysm in the ascending aorta that is about 4cm in size. No evidence of rupture but this needs annual screening to monitor. Patient was counseled on tobacco cessation.  The patient's other chronic conditions, including GERD and HLD, were stable during this hospitalization and the patient was continued on her home medications.  Issues for Follow Up:  1. Annual screening for thoracic aorta aneurysm 2. Tobacco cessation 3. F/u BP control  Significant Procedures: L heart cath (1/8):  70% LAD stenosis. PCI with DES placement.  Significant Labs and Imaging:   Recent Labs Lab 08/27/14 1240 08/29/14 2010 08/29/14 2200 08/30/14 0307  WBC 7.0 5.9  --  5.5  HGB 14.2 11.6*  --  11.1*  HCT 41.6 35.1*  --  33.5*  PLT 324 254 251 237    Recent Labs Lab 08/27/14 1240 08/29/14 2010 08/30/14 0307  NA 137  --  137  K 4.0  --  3.8  CL 100  --  104  CO2 26  --  22  GLUCOSE 95  --  110*  BUN 9  --  9  CREATININE 0.84 0.98 1.01  CALCIUM 10.3  --  8.6   BNP 9.7  Dg Chest 2 View 08/27/2014 IMPRESSION: No active cardiopulmonary disease.   Ct Angio Chest Pe W/cm &/or Wo Cm 08/27/2014 IMPRESSION: 1. No filling defect is identified in the pulmonary arterial tree to suggest pulmonary embolus. 2. Airway thickening is present, suggesting bronchitis or reactive airways disease. 3. Coronary artery atherosclerosis. 4. 4.0 cm in diameter ascending thoracic aortic aneurysm. Recommend annual imaging followup by CTA or MRA. This recommendation follows 2010 ACCF/AHA/AATS/ACR/ASA/SCA/SCAI/SIR/STS/SVM Guidelines for the Diagnosis and Management of Patients With Thoracic Aortic Disease.    Results/Tests Pending at Time of Discharge: None  Discharge Medications:    Medication List    STOP taking these medications        atorvastatin 80 MG tablet  Commonly known as:  LIPITOR     nitrofurantoin (macrocrystal-monohydrate) 100 MG capsule  Commonly known as:  MACROBID      TAKE these medications        albuterol 108 (90 BASE) MCG/ACT inhaler  Commonly known as:  PROVENTIL HFA;VENTOLIN HFA  Inhale 1-2 puffs into the lungs every 6 (six) hours as needed for wheezing or shortness of breath.     aspirin EC 81 MG tablet  Take 81 mg by mouth daily.     clopidogrel 75 MG tablet  Commonly known as:  PLAVIX  Take 1 tablet (75 mg total) by mouth daily with breakfast.     FISH OIL/D3 ADULT GUMMIES 184-250 MG-UNIT Chew  Chew 2 capsules by mouth daily.     HYDROcodone-homatropine  5-1.5 MG/5ML syrup  Commonly known as:  HYCODAN  Take 5 mLs by mouth every 6 (six) hours as needed for cough.     lisinopril 20 MG tablet  Commonly known as:  PRINIVIL,ZESTRIL  Take 1 tablet (20 mg total) by mouth daily.     multivitamin tablet  Take 1 tablet by mouth daily.     rosuvastatin 40 MG tablet  Commonly known as:  CRESTOR  Take 40 mg by mouth daily.        Discharge Instructions: Please refer to Patient Instructions section of EMR for full details.  Patient was counseled important signs and symptoms that should prompt return to medical care, changes in medications, dietary instructions, activity restrictions, and follow up appointments.   Follow-Up Appointments: Follow-up Information    Follow up with McKeag, Janine Ores, MD On 09/12/2014.   Specialty:  Family Medicine   Contact information:   [address] [place] [address] [phone]       Follow up with Kevin Fenton, MD On 09/03/2014.   Specialty:  Family Medicine   Why:  :15a  for hospital follow-up   Contact information:   [address] [place] [address] [phone]       Follow up with Thurmon Fair, MD.   Specialty:  Cardiology   Why:  For hospital follow-up   Contact information:   [address] [place] [address] [phone]       Shirlee Latch, MD 08/30/2014, 9:32 AM PGY-1, [facility]

## 2014-08-28 NOTE — Consult Note (Signed)
Reason for Consult: Chest pain, CAD  Requesting Physician: Hensel  Cardiologist: None  HPI: This is a 49 y.o. female with a past medical history significant for mild CAD (40% LAD, 40% OM reported on cath 2011 in [place]), ongoing tobacco abuse, hyperlipidemia and obesity, untreated OSA presents with chest pain at rest (started as sharp, later evolved into more typical chest pressure) and has dynamic T wave changes on ECG (inversion in I and aVL yesterday, normalized today), normal troponin. Also had dyspnea, probably orthopnea, when chest pain was happening. Has persistent leg edema. Chronic cough. Recent UTI. Significant social stressors - evicted from her home, still has a job.  Echo and CT angio show minimal aortic stenosis and an incipient aneurysm of the ascending aorta (4.0 cm).  Normal LVEF and wall motion.  PMHx:  Past Medical History  Diagnosis Date  . Benign essential HTN 01/24/2012  . Tobacco abuse 01/26/2012    2ppd from 1998-2008. Stopped 2008-2011. Restarted after death of husband. As of 01/24/12, smoking 1ppd. Approximately 25 pack year history. Would like to quit.   . Coronary artery disease, non-occlusive 01/26/2012    Cardiac cath in 2009 performed [place]. Virgel ManifoldJoseph Mobley [place] 40% stenosis of LAD proximal to 2nd diagonal. EF 55%  . Hyperlipidemia LDL goal < 100 02/17/2012  . Amaurosis fugax of right eye 2011    "~ 15""  . Heart murmur   . Sleep apnea     "left mask in [place] in 2012; haven't got new one" (08/27/2013)  . GERD (gastroesophageal reflux disease)   . Migraine     "probably 3 times/month" (08/27/2014)  . Arthritis     "knees, right ankle, neck" (08/27/2014)  . Depression     "lately" (08/27/2014)   Past Surgical History  Procedure Laterality Date  . Tonsillectomy and adenoidectomy  1970  . Nm myoview ltd  2011    normal perfusion, no iscemia, EF 54%  . Cesarean section  1998; 2000  . Cholecystectomy    . Fracture surgery    . Closed  reduction hand fracture Right 1979  . Tubal ligation  2000  . Cardiac catheterization  2009    40% LAD stenosis at one junction; 40% stenosis if high obtuse marginal off l. circ    FAMHx: Family History  Problem Relation Age of Onset  . Heart disease Mother [id number]    First MI  . Hypertension Mother   . Kidney disease Mother   . Hyperlipidemia Mother   . Diabetes Mother   . Diabetes Father   . Hypertension Father   . Heart disease Maternal Grandmother   . Heart disease Maternal Grandfather   . Heart disease Paternal Grandmother   . Heart disease Paternal Grandfather     SOCHx:  reports that she has been smoking Cigarettes.  She has a 12.5 pack-year smoking history. She has never used smokeless tobacco. She reports that she drinks alcohol. She reports that she does not use illicit drugs.  ALLERGIES: No Known Allergies  ROS: The patient specifically denies syncope, palpitations, focal neurological deficits, intermittent claudication, unexplained weight gain, hemoptysis or wheezing. Has UTI symptoms (dysuria, frequency, urgency), improving The patient also denies abdominal pain, nausea, vomiting, dysphagia, diarrhea, constipation, polyuria, polydipsia,  abnormal bleeding or bruising, fever, chills, unexpected weight changes, mood swings, change in skin or hair texture, change in voice quality, auditory or visual problems, allergic reactions or rashes, new musculoskeletal complaints other than usual "aches and pains".  HOME MEDICATIONS:   HOSPITAL MEDICATIONS: Prior to Admission:  Prescriptions prior to admission  Medication Sig Dispense Refill Last Dose  . albuterol (PROVENTIL HFA;VENTOLIN HFA) 108 (90 BASE) MCG/ACT inhaler Inhale 1-2 puffs into the lungs every 6 (six) hours as needed for wheezing or shortness of breath. 1 Inhaler 0 Past Month at Unknown time  . aspirin EC 81 MG tablet Take 81 mg by mouth daily.   08/27/2014 at Unknown time  . Fish Oil-Cholecalciferol (FISH OIL/D3  ADULT GUMMIES) 184-250 MG-UNIT CHEW Chew 2 capsules by mouth daily.   08/26/2014 at Unknown time  . lisinopril (PRINIVIL,ZESTRIL) 20 MG tablet Take 1 tablet (20 mg total) by mouth daily. 30 tablet 5 08/27/2014 at Unknown time  . Multiple Vitamin (MULTIVITAMIN) tablet Take 1 tablet by mouth daily.   08/26/2014 at Unknown time  . rosuvastatin (CRESTOR) 40 MG tablet Take 40 mg by mouth daily.   08/26/2014 at Unknown time  . atorvastatin (LIPITOR) 80 MG tablet Take 1 tablet (80 mg total) by mouth daily. (Patient not taking: Reported on 08/27/2014) 30 tablet 11 Not Taking at Unknown time  . HYDROcodone-homatropine (HYCODAN) 5-1.5 MG/5ML syrup Take 5 mLs by mouth every 6 (six) hours as needed for cough. (Patient not taking: Reported on 08/18/2014) 120 mL 0 Not Taking at Unknown time  . nitrofurantoin, macrocrystal-monohydrate, (MACROBID) 100 MG capsule Take 1 capsule (100 mg total) by mouth 2 (two) times daily. (Patient not taking: Reported on 08/27/2014) 10 capsule 0 Completed Course at Unknown time   Scheduled: . aspirin EC  81 mg Oral Daily  . atorvastatin  80 mg Oral Daily  . heparin  5,000 Units Subcutaneous 3 times per day  . lisinopril  20 mg Oral Daily  . pantoprazole  40 mg Oral Daily    VITALS: Blood pressure 98/60, pulse 80, temperature 97.8 F (36.6 C), temperature source Oral, resp. rate 19, height  (1.676 m), weight 250 lb (113.399 kg), last menstrual period 07/02/2013, SpO2 98 %.  PHYSICAL EXAM:  General: Alert, oriented x3, no distress, obese Head: no evidence of trauma, PERRL, EOMI, no exophtalmos or lid lag, no myxedema, no xanthelasma; normal ears, nose and oropharynx Neck: Normal jugular venous pulsations and no hepatojugular reflux; brisk carotid pulses without delay and no carotid bruits Chest: clear to auscultation, no signs of consolidation by percussion or palpation, normal fremitus, symmetrical and full respiratory excursions Cardiovascular: normal position and quality of the  apical impulse, regular rhythm, normal first heart sound and normal second heart sound, no rubs or gallops, 2/6 early peaking aortic ejection murmur Abdomen: no tenderness or distention, no masses by palpation, no abnormal pulsatility or arterial bruits, normal bowel sounds, no hepatosplenomegaly Extremities: no clubbing, cyanosis;  1+ symmetrical ankle  edema; 2+ radial, ulnar and brachial pulses bilaterally; 2+ right femoral, posterior tibial and dorsalis pedis pulses; 2+ left femoral, posterior tibial and dorsalis pedis pulses; no subclavian or femoral bruits Neurological: grossly nonfocal   LABS  CBC  Recent Labs  08/27/14 1240  WBC 7.0  HGB 14.2  HCT 41.6  MCV 99.8  PLT 324   Basic Metabolic Panel  Recent Labs  08/27/14 1240  NA 137  K 4.0  CL 100  CO2 26  GLUCOSE 95  BUN 9  CREATININE 0.84  CALCIUM 10.3   Liver Function Tests No results for input(s): AST, ALT, ALKPHOS, BILITOT, PROT, ALBUMIN in the last 72 hours. No results for input(s): LIPASE, AMYLASE in the last 72 hours. Cardiac Enzymes  Recent Labs  08/27/14 1821 08/28/14 0012 08/28/14 0825  TROPONINI <0.03 <0.03 <0.03     IMAGING: Dg Chest 2 View  08/27/2014   CLINICAL DATA:  Cough for 3 weeks.  Mid chest pain.  EXAM: CHEST  2 VIEW  COMPARISON:  05/08/2014  FINDINGS: The heart size and mediastinal contours are within normal limits. Both lungs are clear. The visualized skeletal structures are unremarkable.  IMPRESSION: No active cardiopulmonary disease.   Electronically Signed   By: Myles Rosenthal M.D.   On: 08/27/2014 13:49   Ct Angio Chest Pe W/cm &/or Wo Cm  08/27/2014   CLINICAL DATA:  Central chest pain with shortness of breath and nausea while driving to work.  EXAM: CT ANGIOGRAPHY CHEST WITH CONTRAST  TECHNIQUE: Multidetector CT imaging of the chest was performed using the standard protocol during bolus administration of intravenous contrast. Multiplanar CT image reconstructions and MIPs were obtained  to evaluate the vascular anatomy.  CONTRAST:  1 OMNIPAQUE IOHEXOL 300 MG/ML SOLN, OMNIPAQUE IOHEXOL 350 MG/ML SOLN  COMPARISON:  08/27/2014 chest radiograph  FINDINGS: No filling defect is identified in the pulmonary arterial tree to suggest pulmonary embolus.  Ascending thoracic aortic diameter 4.0 cm. Mild atherosclerotic calcification in the aortic arch. Left anterior descending coronary artery atherosclerotic calcification. Borderline enlargement of the left ventricle.  No pathologic thoracic adenopathy.  Mild central airway thickening noted.  Otherwise lungs appear clear.  Review of the MIP images confirms the above findings.  IMPRESSION: 1. No filling defect is identified in the pulmonary arterial tree to suggest pulmonary embolus. 2. Airway thickening is present, suggesting bronchitis or reactive airways disease. 3. Coronary artery atherosclerosis. 4. 4.0 cm in diameter ascending thoracic aortic aneurysm. Recommend annual imaging followup by CTA or MRA. This recommendation follows 2010 ACCF/AHA/AATS/ACR/ASA/SCA/SCAI/SIR/STS/SVM Guidelines for the Diagnosis and Management of Patients With Thoracic Aortic Disease. Circulation. 2010; 121: Z610-R604   Electronically Signed   By: Herbie Baltimore M.D.   On: 08/27/2014 15:06    ECG: NSR, transient T wave inversion I and aVL with chest pain  TELEMETRY: NSR  IMPRESSION: 1. Probable unstable angina 2. Known (mild) CAD by cath 2011, 40% LAD, 40% OM1 3. Small ascending aortic aneurysm 4. Minimal aortic valve stenosis (a bicuspid valve could explain both the aneurysm and early AS; reviewed echo images, cannot firmly define valve anatomy) 5. Ongoing tobacco abuse 6. Untreated OSA 7. HTN 8. Hyperlipidemia  RECOMMENDATION: 1. Coronary angiography tomorrow (she has eaten today); This procedure has been fully reviewed with the patient and informed consent has been obtained. 2. Reassurance re; aneurysm of ascending Ao. Recommend periodic monitoring,  no sooner than 12 months unless unexplained chest pain recurs 3. Add beta blockers - metoprolol 25 mg BID 4. Smoking cessation 5. Social work help with finding a home 6. Restart CPAP for OSA  Time Spent Directly with Patient: 60 minutes  Thurmon Fair, MD, [facility] [phone] office [phone] pager   08/28/2014, 2:09 PM

## 2014-08-28 NOTE — Progress Notes (Signed)
Pt manual B/P 84/62. Pt asymptomatic. RN paged MD. MD instructed RN to keep pt in bed. Pt currently resting in bed with call bell within reach. Will continue to monitor.   Mahogani Holohan, Dot Beenreama R, RN

## 2014-08-28 NOTE — Progress Notes (Signed)
UR completed 

## 2014-08-28 NOTE — Progress Notes (Signed)
Family Medicine Teaching Service Daily Progress Note Intern Pager: [phone]  Patient name: Holly Dunn Medical record number: [id number] Date of birth: [birth year] Age: 49 y.o. Gender: female  Primary Care Provider: Wende Mott Janine Ores, MD Consultants: Cards Code Status: Full  Pt Overview and Major Events to Date:  1/6 -Admitted for CP rule-out  Assessment and Plan: Holly Dunn is a 49 y.o. female presenting with chest pain x 1 day. PMH is significant for known CAD, HTN, HLD, tobacco use.  # Chest pain: known CAD history. Vitals stable. Not typical anginal pain, worse with deep inspiration may represent costochondritis/MSK, pleuritis. Patient is tender to palpation over lower sternum and has hx of reflux. PE ruled out with CTA chest, also less likely to have pneumonia with this result. CXR normal. EKG showed NSR and persistent TWI in I and aVL slightly deeper than in September. BMP, CBC normal, BNP 9.7, istat troponin negative. She did have an echo done yesterday with EF 50-55% and grade 1 diastolic dysfunction. HEART score 2, but with known CAD and recent outpt referral to cards for further evaluation. - CP r/o unremarkable - cycled troponins neg - daily ASA - will consult cardiology  # Thoracic aortic aneurysm: Seen on CTA chest, 4.0cm in ascending thoracic aorta. Low suspicion as cause of CP; no evidence of rupture.  - outpt follow up with annual screening imaging. - tobacco cessation counseling  #GERD: -will start omeprazole as patient takes OTC medication at home  # Recent UTI: should be finished with macrobid course - will repeat UA if complaining of burning/pain with voids  # HTN: normotensive in ED, but was low overnight to 76/59. - will hold home lisinopril -will monitor  # HLD: - continue home atorva (recently increased to )  # Tobacco use - tobacco cessation counseling done in ED  # Living situation: recently evicted,  - will ask SW to see if any  resources can be provided  FEN/GI: diet heart healthy / saline lock. Prophylaxis: hep sq  Disposition: Possible home today; pending card recs   Subjective:  No overnight events. Chest pain has resolved. She is stating that her BPs have been low all night and that she usually never has low blood pressure. She is also stating that her legs may be a little swollen and are painful today. She does have chronic leg pain.  Objective: Temp:  [97.2 F (36.2 C)-98.9 F (37.2 C)] 98.2 F (36.8 C) (01/07 0352) Pulse Rate:  [65-104] 70 (01/07 0352) Resp:  [11-26] 16 (01/07 0352) BP: (76-147)/(38-85) 102/62 mmHg (01/07 0609) SpO2:  [90 %-100 %] 100 % (01/07 0352) Weight:  [250 lb (113.399 kg)] 250 lb (113.399 kg) (01/06 1814) Physical Exam: General: NAD, laying in bed, normal conversant HEENT: PERRL, EOMI, MMM. Cardiovascular: RRR, normal s1 and s2, no murmurs appreciated. 2+ radial pulses bilaterally Chest: TTP over lower sternum Respiratory: clear bilaterally, normal WOB Abdomen: soft, obese, nontender, nondistended, normal bowel sounds Extremities: 1+ edema bilaterally Skin: no rashes Neuro: alert and oriented, no focal deficits  Laboratory: Results for orders placed or performed during the hospital encounter of 08/27/14 (from the past 24 hour(s))  CBC     Status: Abnormal   Collection Time: 08/27/14 12:40 PM  Result Value Ref Range   WBC 7.0 4.0 - 10.5 K/uL   RBC 4.17 3.87 - 5.11 MIL/uL   Hemoglobin 14.2 12.0 - 15.0 g/dL   HCT 14.7 82.9 - 56.2 %   MCV 99.8 78.0 - 100.0  fL   MCH 34.1 (H) 26.0 - 34.0 pg   MCHC 34.1 30.0 - 36.0 g/dL   RDW 12.9 09.611.5 - 04.515.5 %   Platelets 324 150 - 400 K/uL  Basic metabolic pa16.1nel     Status: Abnormal   Collection Time: 08/27/14 12:40 PM  Result Value Ref Range   Sodium 137 135 - 145 mmol/L   Potassium 4.0 3.5 - 5.1 mmol/L   Chloride 100 96 - 112 mEq/L   CO2 26 19 - 32 mmol/L   Glucose, Bld 95 70 - 99 mg/dL   BUN 9 6 - 23 mg/dL   Creatinine, Ser  4.090.84 0.50 - 1.10 mg/dL   Calcium 81.110.3 8.4 - 91.410.5 mg/dL   GFR calc non Af Amer 81 (L) >90 mL/min   GFR calc Af Amer >90 >90 mL/min   Anion gap 11 5 - 15  BNP (order ONLY if patient complains of dyspnea/SOB AND you have documented it for THIS visit)     Status: None   Collection Time: 08/27/14 12:40 PM  Result Value Ref Range   B Natriuretic Peptide 9.7 0.0 - 100.0 pg/mL  I-stat troponin, ED (not [facility])     Status: None   Collection Time: 08/27/14 12:53 PM  Result Value Ref Range   Troponin i, poc 0.00 0.00 - 0.08 ng/mL   Comment 3          Troponin I (q 6hr x 3)     Status: None   Collection Time: 08/27/14  6:21 PM  Result Value Ref Range   Troponin I <0.03 <0.031 ng/mL  Troponin I (q 6hr x 3)     Status: None   Collection Time: 08/28/14 12:12 AM  Result Value Ref Range   Troponin I <0.03 <0.031 ng/mL    Imaging/Diagnostic Tests: Dg Chest 2 View 08/27/2014  IMPRESSION: No active cardiopulmonary disease.     Ct Angio Chest Pe W/cm &/or Wo Cm 08/27/2014  IMPRESSION: 1. No filling defect is identified in the pulmonary arterial tree to suggest pulmonary embolus. 2. Airway thickening is present, suggesting bronchitis or reactive airways disease. 3. Coronary artery atherosclerosis. 4. 4.0 cm in diameter ascending thoracic aortic aneurysm. Recommend annual imaging followup by CTA or MRA. This recommendation follows 2010 ACCF/AHA/AATS/ACR/ASA/SCA/SCAI/SIR/STS/SVM Guidelines for the Diagnosis and Management of Patients With Thoracic Aortic Disease.    Pincus LargeJazma Y Azadeh Hyder, DO 08/28/2014, 8:40 AM PGY-1, [place] FPTS Intern pager: [phone], text pages welcome

## 2014-08-29 ENCOUNTER — Encounter (HOSPITAL_COMMUNITY): Payer: Self-pay | Admitting: Internal Medicine

## 2014-08-29 ENCOUNTER — Encounter (HOSPITAL_COMMUNITY)
Admission: EM | Disposition: A | Discharge status: Home / Self Care | Payer: Self-pay | Source: Home / Self Care | Attending: Family Medicine

## 2014-08-29 DIAGNOSIS — I200 Unstable angina: Secondary | ICD-10-CM | POA: Insufficient documentation

## 2014-08-29 DIAGNOSIS — I25110 Atherosclerotic heart disease of native coronary artery with unstable angina pectoris: Principal | ICD-10-CM

## 2014-08-29 HISTORY — PX: LEFT HEART CATHETERIZATION WITH CORONARY ANGIOGRAM: SHX5451

## 2014-08-29 LAB — CBC
HCT: 35.1 % — ABNORMAL LOW (ref 36.0–46.0)
Hemoglobin: 11.6 g/dL — ABNORMAL LOW (ref 12.0–15.0)
MCH: 32.7 pg (ref 26.0–34.0)
MCHC: 33 g/dL (ref 30.0–36.0)
MCV: 98.9 fL (ref 78.0–100.0)
Platelets: 254 10*3/uL (ref 150–400)
RBC: 3.55 MIL/uL — ABNORMAL LOW (ref 3.87–5.11)
RDW: 12.7 % (ref 11.5–15.5)
WBC: 5.9 10*3/uL (ref 4.0–10.5)

## 2014-08-29 LAB — PROTIME-INR
INR: 1.04 (ref 0.00–1.49)
Prothrombin Time: 13.7 seconds (ref 11.6–15.2)

## 2014-08-29 LAB — CREATININE, SERUM
Creatinine, Ser: 0.98 mg/dL (ref 0.50–1.10)
GFR, EST AFRICAN AMERICAN: 78 mL/min — AB (ref >90)
GFR, EST NON AFRICAN AMERICAN: 67 mL/min — AB (ref >90)

## 2014-08-29 LAB — PLATELET COUNT: Platelets: 251 10*3/uL (ref 150–400)

## 2014-08-29 SURGERY — LEFT HEART CATHETERIZATION WITH CORONARY ANGIOGRAM
Anesthesia: LOCAL

## 2014-08-29 MED ORDER — HEPARIN SODIUM (PORCINE) 1000 UNIT/ML IJ SOLN
INTRAMUSCULAR | Status: AC
  Filled 2014-08-29: qty 1

## 2014-08-29 MED ORDER — ACETAMINOPHEN 325 MG PO TABS: 650.0000 mg | ORAL_TABLET | ORAL | Status: DC | PRN

## 2014-08-29 MED ORDER — FENTANYL CITRATE 0.05 MG/ML IJ SOLN
INTRAMUSCULAR | Status: AC
  Filled 2014-08-29: qty 2

## 2014-08-29 MED ORDER — SODIUM CHLORIDE 0.9 % IV SOLN: 1.0000 mL/kg/h | INTRAVENOUS | Status: AC

## 2014-08-29 MED ORDER — VERAPAMIL HCL 2.5 MG/ML IV SOLN
INTRAVENOUS | Status: AC
  Filled 2014-08-29: qty 2

## 2014-08-29 MED ORDER — HEPARIN SODIUM (PORCINE) 5000 UNIT/ML IJ SOLN
5000.0000 [IU] | Freq: Three times a day (TID) | INTRAMUSCULAR | Status: DC
  Administered 2014-08-30: 07:00:00 5000 [IU] via SUBCUTANEOUS
  Filled 2014-08-29 (×4): qty 1

## 2014-08-29 MED ORDER — MIDAZOLAM HCL 2 MG/2ML IJ SOLN
INTRAMUSCULAR | Status: AC
  Filled 2014-08-29: qty 2

## 2014-08-29 MED ORDER — TIROFIBAN HCL IV 5 MG/100ML
0.1500 ug/kg/min | INTRAVENOUS | Status: AC
  Filled 2014-08-29: qty 100

## 2014-08-29 MED ORDER — CLOPIDOGREL BISULFATE 300 MG PO TABS
ORAL_TABLET | ORAL | Status: AC
  Filled 2014-08-29: qty 1

## 2014-08-29 MED ORDER — LIDOCAINE HCL (PF) 1 % IJ SOLN
INTRAMUSCULAR | Status: AC
  Filled 2014-08-29: qty 30

## 2014-08-29 MED ORDER — TIROFIBAN HCL IV 12.5 MG/250 ML
INTRAVENOUS | Status: AC
  Filled 2014-08-29: qty 250

## 2014-08-29 MED ORDER — SODIUM CHLORIDE 0.9 % IJ SOLN: 3.0000 mL | INTRAMUSCULAR | Status: DC | PRN

## 2014-08-29 MED ORDER — ASPIRIN 81 MG PO CHEW
81.0000 mg | CHEWABLE_TABLET | ORAL | Status: AC
  Administered 2014-08-29: 81 mg via ORAL
  Filled 2014-08-29: qty 1

## 2014-08-29 MED ORDER — ONDANSETRON HCL 4 MG/2ML IJ SOLN: 4.0000 mg | Freq: Four times a day (QID) | INTRAMUSCULAR | Status: DC | PRN

## 2014-08-29 MED ORDER — HEPARIN (PORCINE) IN NACL 2-0.9 UNIT/ML-% IJ SOLN
INTRAMUSCULAR | Status: AC
  Filled 2014-08-29: qty 1000

## 2014-08-29 MED ORDER — SODIUM CHLORIDE 0.9 % IJ SOLN: 3.0000 mL | Freq: Two times a day (BID) | INTRAMUSCULAR | Status: DC

## 2014-08-29 MED ORDER — ASPIRIN 81 MG PO CHEW: 81.0000 mg | CHEWABLE_TABLET | Freq: Every day | ORAL | Status: DC

## 2014-08-29 MED ORDER — SODIUM CHLORIDE 0.9 % IV SOLN
INTRAVENOUS | Status: DC
  Administered 2014-08-29: 05:00:00 via INTRAVENOUS

## 2014-08-29 MED ORDER — CLOPIDOGREL BISULFATE 75 MG PO TABS
75.0000 mg | ORAL_TABLET | Freq: Every day | ORAL | Status: DC
  Administered 2014-08-30: 08:00:00 75 mg via ORAL
  Filled 2014-08-29: qty 1

## 2014-08-29 MED ORDER — NITROGLYCERIN 1 MG/10 ML FOR IR/CATH LAB
INTRA_ARTERIAL | Status: AC
  Filled 2014-08-29: qty 10

## 2014-08-29 MED ORDER — ROSUVASTATIN CALCIUM 40 MG PO TABS
40.0000 mg | ORAL_TABLET | Freq: Every day | ORAL | Status: DC
  Administered 2014-08-29 – 2014-08-30 (×2): 40 mg via ORAL
  Filled 2014-08-29 (×2): qty 1

## 2014-08-29 MED ORDER — SODIUM CHLORIDE 0.9 % IV SOLN: 250.0000 mL | INTRAVENOUS | Status: DC | PRN

## 2014-08-29 MED ORDER — ADENOSINE 12 MG/4ML IV SOLN
16.0000 mL | Freq: Once | INTRAVENOUS | Status: DC
  Filled 2014-08-29: qty 16

## 2014-08-29 NOTE — Clinical Social Work Note (Signed)
CS[place] as well as a Cabin crewshelter list/food access list.  Patient does report that she has a car and access to friends houses if she is unable to find permanent housing before DC.  Patient reports no other needs at this time.  CSW signing off for now.  Merlyn LotJenna Holoman, LCSWA Clinical Social Worker [phone]

## 2014-08-29 NOTE — CV Procedure (Signed)
       PROCEDURE:  FFR LAD, PCI LAD.  INDICATIONS:  Unstable angina  The risks, benefits, and details of the procedure were explained to the patient.  The patient verbalized understanding and wanted to proceed.  Informed written consent was obtained.  PROCEDURE TECHNIQUE:  A diagnostic catheterization was done by Dr. Gala RomneyBensimhon.   There was a 70%, hazy stenosis in the mid LAD.  IV heparin and IV tirofiban were used for anticoagulation. ACT was used to check that the anticoagulation was therapeutic. A EBU 3.0 5 [place] guide was used to engage the left main.  A pressure wire was then advanced and normalized in the left main. The wire was placed across the area disease in the mid LAD. Resting FFR was 0.87. After IV adenosine was given, FFR decreased to 0.75. The lesion was felt to be significant. A 2.5 x 12 balloon was used to predilate. A 2.75 x 16 Promus drug-eluting stent was then deployed at high pressure. The midportion of the stent was postdilated with a 3.0 x 12 noncompliant balloon to high pressure. Intracoronary nitroglycerin was given. There was an excellent angiographic result. The patient did have typical anginal symptoms with balloon inflations. A TR band was used for hemostasis.   CONTRAST:  Total of 80 cc.  COMPLICATIONS:  None.    HEMODYNAMICS:  Resting FFR 0.87. FFR after maximal hyperemia with adenosine 0.75.       IMPRESSIONS:  1. 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 This was successfully treated with a 2.75 x 16 Promus drug-eluting stent, postdilated to greater than 3 mm in diameter.   RECOMMENDATION:  She will need dual antiplatelet therapy for at least a year. She will be watched overnight. Cardiology follow-up with Dr Royann Shiversroitoru.

## 2014-08-29 NOTE — Progress Notes (Signed)
UR completed 

## 2014-08-29 NOTE — H&P (View-Only) (Signed)
   Subjective: Denies CP this AM  No SOB   Objective: Filed Vitals:   08/28/14 2024 08/29/14 0343 08/29/14 0434 08/29/14 0753  BP: 117/71 111/78  116/60  Pulse: 62 67  68  Temp: 98.2 F (36.8 C) 98.3 F (36.8 C)  98.3 F (36.8 C)  TempSrc: Oral Oral  Oral  Resp: 18 20  20  Height:      Weight:   247 lb 11.2 oz (112.356 kg)   SpO2: 98% 100%     Weight change: -2 lb 4.8 oz (-1.043 kg)  Intake/Output Summary (Last 24 hours) at 08/29/14 0758 Last data filed at 08/28/14 1700  Gross per 24 hour  Intake    240 ml  Output      0 ml  Net    240 ml    General: Alert, awake, oriented x3, in no acute distress Neck:  JVP is normal Heart: Regular rate and rhythm, without murmurs, rubs, gallops.  Lungs: Clear to auscultation.  No rales or wheezes. Exemities:  No edema.   Neuro: Grossly intact, nonfocal.  Tele  SR   Lab Results: Results for orders placed or performed during the hospital encounter of 08/27/14 (from the past 24 hour(s))  Troponin I (q 6hr x 3)     Status: None   Collection Time: 08/28/14  8:25 AM  Result Value Ref Range   Troponin I <0.03 <0.031 ng/mL  Protime-INR     Status: None   Collection Time: 08/29/14  3:55 AM  Result Value Ref Range   Prothrombin Time 13.7 11.6 - 15.2 seconds   INR 1.04 0.00 - 1.49    Studies/Results: No results found.  Medications: REviewed     @PROBHOSP@  1.  CP  / Unstable angina  For L heart cath today.  Patient currently pain free    2.  Tob use  Needs to quit   Continue counselling     3.  OSA  Needs to be treated    LOS: 2 days   Holly Dunn 08/29/2014, 7:58 AM   

## 2014-08-29 NOTE — Progress Notes (Signed)
   Subjective: Denies CP this AM  No SOB   Objective: Filed Vitals:   08/28/14 2024 08/29/14 0343 08/29/14 0434 08/29/14 0753  BP: 117/71 111/78  116/60  Pulse: 62 67  68  Temp: 98.2 F (36.8 C) 98.3 F (36.8 C)  98.3 F (36.8 C)  TempSrc: Oral Oral  Oral  Resp: 18 20  20   Height:      Weight:   247 lb 11.2 oz (112.356 kg)   SpO2: 98% 100%     Weight change: -2 lb 4.8 oz (-1.043 kg)  Intake/Output Summary (Last 24 hours) at 08/29/14 0758 Last data filed at 08/28/14 1700  Gross per 24 hour  Intake    240 ml  Output      0 ml  Net    240 ml    General: Alert, awake, oriented x3, in no acute distress Neck:  JVP is normal Heart: Regular rate and rhythm, without murmurs, rubs, gallops.  Lungs: Clear to auscultation.  No rales or wheezes. Exemities:  No edema.   Neuro: Grossly intact, nonfocal.  Tele  SR   Lab Results: Results for orders placed or performed during the hospital encounter of 08/27/14 (from the past 24 hour(s))  Troponin I (q 6hr x 3)     Status: None   Collection Time: 08/28/14  8:25 AM  Result Value Ref Range   Troponin I <0.03 <0.031 ng/mL  Protime-INR     Status: None   Collection Time: 08/29/14  3:55 AM  Result Value Ref Range   Prothrombin Time 13.7 11.6 - 15.2 seconds   INR 1.04 0.00 - 1.49    Studies/Results: No results found.  Medications: REviewed     @PROBHOSP @  1.  CP  / Unstable angina  For L heart cath today.  Patient currently pain free    2.  Tob use  Needs to quit   Continue counselling     3.  OSA  Needs to be treated    LOS: 2 days   [name] 08/29/2014, 7:58 AM

## 2014-08-29 NOTE — Progress Notes (Signed)
Family Medicine Teaching Service Daily Progress Note Intern Pager: [phone]  Patient name: Holly Dunn Bess Medical record number: [id number] Date of birth: 01-31-1966 Age: 49 y.o. Gender: female  Primary Care Provider: Kathee DeltonMcKeag, Ian D, MD Consultants: Cards Code Status: Full  Pt Overview and Major Events to Date:  1/6 - Admitted for CP rule-out 1/7 - Hypotension noted despite holding meds 1/8 - coronary angiograph today  Assessment and Plan: Holly Dunn Law is a 49 y.o. female presenting with chest pain x 1 day. PMH is significant for known CAD, HTN, HLD, tobacco use.  # Chest pain: known CAD history. Vitals stable. Not typical anginal pain, worse with deep inspiration may represent costochondritis/MSK, pleuritis. Patient is tender to palpation over lower sternum and has hx of reflux. PE ruled out with CTA chest, also less likely to have pneumonia with this result. CXR normal. EKG showed NSR and persistent TWI in I and aVL slightly deeper than in September. BMP, CBC normal, BNP 9.7, istat troponin negative. She did have an echo done yesterday with EF 50-55% and grade 1 diastolic dysfunction. HEART score 2, but with known CAD and recent outpt referral to cards for further evaluation. - CP r/o unremarkable - cycled troponins neg - daily ASA - f/u cardiology recs - patient will go for a coronary angiography today  # Thoracic aortic aneurysm: Seen on CTA chest, 4.0cm in ascending thoracic aorta. Low suspicion as cause of CP; no evidence of rupture.  - outpt follow up with annual screening imaging. Sooner if reoccurrence of unexplained chest pain.  - tobacco cessation counseling  #GERD: -will start omeprazole as patient takes OTC medication at home  # Recent UTI: should be finished with macrobid course - will repeat UA if complaining of burning/pain with voids  # HTN: normotensive in ED, but been lower than baseline all admission. - will hold home lisinopril -cardiology started patient  on metoprolol 25mg  BID -will monitor  # HLD: - continue home atorva (recently increased to 80mg )  # Tobacco use - tobacco cessation counseling done in ED  #OSA -will restart CPAP  # Living situation: recently evicted,  -SW and case management consulted   FEN/GI: NPO/ NS IVF Prophylaxis: hep sq  Disposition: Pending cards recs after procedure. Possible home today.  Subjective:  No overnight events. Chest pain has resolved. She has no concerns today.    Objective: Temp:  [97.8 F (36.6 C)-98.3 F (36.8 C)] 98.3 F (36.8 C) (01/08 0343) Pulse Rate:  [62-80] 67 (01/08 0343) Resp:  [18-20] 20 (01/08 0343) BP: (94-117)/(58-78) 111/78 mmHg (01/08 0343) SpO2:  [98 %-100 %] 100 % (01/08 0343) Weight:  [247 lb 11.2 oz (112.356 kg)] 247 lb 11.2 oz (112.356 kg) (01/08 0434) Physical Exam: General: NAD, laying in bed, normal conversant HEENT: PERRL, EOMI, MMM. Cardiovascular: RRR, normal s1 and s2, no murmurs appreciated. 2+ radial pulses bilaterally Chest: TTP over lower sternum Respiratory: clear bilaterally, normal WOB Abdomen: soft, obese, nontender, nondistended, normal bowel sounds Extremities: trace edema bilaterally Skin: no rashes Neuro: alert and oriented, no focal deficits  Laboratory: Results for orders placed or performed during the hospital encounter of 08/27/14 (from the past 24 hour(s))  Troponin I (q 6hr x 3)     Status: None   Collection Time: 08/28/14  8:25 AM  Result Value Ref Range   Troponin I <0.03 <0.031 ng/mL  Protime-INR     Status: None   Collection Time: 08/29/14  3:55 AM  Result Value Ref Range  Prothrombin Time 13.7 11.6 - 15.2 seconds   INR 1.04 0.00 - 1.49    Imaging/Diagnostic Tests: Dg Chest 2 View 08/27/2014  IMPRESSION: No active cardiopulmonary disease.     Ct Angio Chest Pe W/cm &/or Wo Cm 08/27/2014  IMPRESSION: 1. No filling defect is identified in the pulmonary arterial tree to suggest pulmonary embolus. 2. Airway thickening is  present, suggesting bronchitis or reactive airways disease. 3. Coronary artery atherosclerosis. 4. 4.0 cm in diameter ascending thoracic aortic aneurysm. Recommend annual imaging followup by CTA or MRA. This recommendation follows 2010 ACCF/AHA/AATS/ACR/ASA/SCA/SCAI/SIR/STS/SVM Guidelines for the Diagnosis and Management of Patients With Thoracic Aortic Disease.    Pincus Large, DO 08/29/2014, 5:56 AM PGY-1, [facility] FPTS Intern pager: [phone], text pages welcome

## 2014-08-29 NOTE — CV Procedure (Signed)
Cardiac Cath Procedure Note:  Indication: CP/unstable angina  Procedures performed:  1) Selective coronary angiography 2) Left heart catheterization 3) Left ventriculogram  Description of procedure:   The risks and indication of the procedure were explained. Consent was signed and placed on the chart. An appropriate timeout was taken prior to the procedure. After a normal Allen's test was confirmed, the right wrist was prepped and draped in the routine sterile fashion and anesthetized with 1% local lidocaine.   A 5 FR arterial sheath was then placed in the right radial artery using a modified Seldinger technique. Systemic heparin was administered. 3mg  IV verapamil was given through the sheath. Standard catheters including a JL 3.5, JR4 and straight pigtail were used. All catheter exchanges were made over a wire.  Complications:  None apparent  Total contrast: 80 cc  Findings:  Ao Pressure: 126/77 (98) LV Pressure: 123/12/17 There was no signficant gradient across the aortic valve on pullback.  Left main: Normal  LAD: Long vessel wraps apex. Gives off 3 diagonals. There is mild diffuse plaque throughout the proximal and midsection. In the midsection at the takeoff of the second diagonal there is a 70% somewhat hazy lesion  LCX: Nondominant. Gives of 3 OMs and large PL. Mild plaque in the AV groove LVC. In the small OM-2 there are tandem tubular 40-50% lesions.   RCA: Dominant vessels. Proximal 30% lesion.   LV-gram done in the RAO projection: Ejection fraction = 55% No regional wall motion abnormalities  Assessment: 1. Mild diffuse CAD with borderline lesion in mid LAD 2. Normal LV function  Plan/Discussion:  Will proceed with flow-wire interrogation of mid LAD lesion with Dr. Eldridge DaceVaranasi. Will need aggressive risk factor management and smoking cessation.   Holly Meresaniel Shandi Godfrey MD 3:10 PM

## 2014-08-29 NOTE — Interval H&P Note (Signed)
Cath Lab Visit (complete for each Cath Lab visit)  Clinical Evaluation Leading to the Procedure:   ACS: No.  Non-ACS:    Anginal Classification: CCS IV  Anti-ischemic medical therapy: Minimal Therapy (1 class of medications)  Non-Invasive Test Results: No non-invasive testing performed  Prior CABG: No previous CABG      History and Physical Interval Note:  08/29/2014 2:22 PM  Holly KaufmanAngela B Dunn  has presented today for surgery, with the diagnosis of cp  The various methods of treatment have been discussed with the patient and family. After consideration of risks, benefits and other options for treatment, the patient has consented to  Procedure(s): LEFT HEART CATHETERIZATION WITH CORONARY ANGIOGRAM (N/A) as a surgical intervention .  The patient's history has been reviewed, patient examined, no change in status, stable for surgery.  I have reviewed the patient's chart and labs.  Questions were answered to the patient's satisfaction.     Daniel Bensimhon

## 2014-08-30 DIAGNOSIS — I10 Essential (primary) hypertension: Secondary | ICD-10-CM

## 2014-08-30 DIAGNOSIS — R079 Chest pain, unspecified: Secondary | ICD-10-CM

## 2014-08-30 DIAGNOSIS — R600 Localized edema: Secondary | ICD-10-CM

## 2014-08-30 LAB — CBC
HEMATOCRIT: 33.5 % — AB (ref 36.0–46.0)
Hemoglobin: 11.1 g/dL — ABNORMAL LOW (ref 12.0–15.0)
MCH: 32.9 pg (ref 26.0–34.0)
MCHC: 33.1 g/dL (ref 30.0–36.0)
MCV: 99.4 fL (ref 78.0–100.0)
PLATELETS: 237 10*3/uL (ref 150–400)
RBC: 3.37 MIL/uL — AB (ref 3.87–5.11)
RDW: 12.8 % (ref 11.5–15.5)
WBC: 5.5 10*3/uL (ref 4.0–10.5)

## 2014-08-30 LAB — BASIC METABOLIC PANEL
ANION GAP: 11 (ref 5–15)
BUN: 9 mg/dL (ref 6–23)
CO2: 22 mmol/L (ref 19–32)
Calcium: 8.6 mg/dL (ref 8.4–10.5)
Chloride: 104 mEq/L (ref 96–112)
Creatinine, Ser: 1.01 mg/dL (ref 0.50–1.10)
GFR calc non Af Amer: 65 mL/min — ABNORMAL LOW (ref >90)
GFR, EST AFRICAN AMERICAN: 75 mL/min — AB (ref >90)
GLUCOSE: 110 mg/dL — AB (ref 70–99)
POTASSIUM: 3.8 mmol/L (ref 3.5–5.1)
SODIUM: 137 mmol/L (ref 135–145)

## 2014-08-30 MED ORDER — CLOPIDOGREL BISULFATE 75 MG PO TABS: 75.0000 mg | ORAL_TABLET | Freq: Every day | ORAL | Status: DC

## 2014-08-30 MED ORDER — NITROGLYCERIN 0.4 MG SL SUBL: 0.4000 mg | SUBLINGUAL_TABLET | SUBLINGUAL | Status: AC | PRN

## 2014-08-30 NOTE — Discharge Instructions (Signed)
Discharge Date: 08/30/2014  Reason for Hospitalization: Chest pain  We did a work-up of your chest pain and we are unsure of were it could be coming from at this time. We r/o any life threatening causes. Please continue home medications and take plavix and aspirin for your stent. You will need to follow-up at your appointment that was scheduled for hospital follow-up.    Thank you for letting us participate in your care!  **PLEASE REMEMBER TO BRING ALL OF YOUR MEDICATIONS TO EACH OF YOUR FOLLOW-UP OFFICE VISITS.  NO HEAVY LIFTING OR SEXUAL ACTIVITY X 7 DAYS. NO DRIVING X 3-5 DAYS. NO SOAKING BATHS, HOT TUBS, POOLS, ETC., X 7 DAYS.  Radial Site Care Refer to this sheet in the next few weeks. These instructions provide you with information on caring for yourself after your procedure. Your caregiver may also give you more specific instructions. Your treatment has been planned according to current medical practices, but problems sometimes occur. Call your caregiver if you have any problems or questions after your procedure. HOME CARE INSTRUCTIONS  You may shower the day after the procedure.Remove the bandage (dressing) and gently wash the site with plain soap and water.Gently pat the site dry.   Do not apply powder or lotion to the site.   Do not submerge the affected site in water for 3 to 5 days.   Inspect the site at least twice daily.   Do not flex or bend the affected arm for 24 hours.   No lifting over 5 pounds (2.3 kg) for 5 days after your procedure.   Do not drive home if you are discharged the same day of the procedure. Have someone else drive you.   What to expect:  Any bruising will usually fade within 1 to 2 weeks.   Blood that collects in the tissue (hematoma) may be painful to the touch. It should usually decrease in size and tenderness within 1 to 2 weeks.  SEEK IMMEDIATE MEDICAL CARE IF:  You have unusual pain at the radial site.   You have redness, warmth,  swelling, or pain at the radial site.   You have drainage (other than a small amount of blood on the dressing).   You have chills.   You have a fever or persistent symptoms for more than 72 hours.   You have a fever and your symptoms suddenly get worse.   Your arm becomes pale, cool, tingly, or numb.   You have heavy bleeding from the site. Hold pressure on the site.     Angina Pectoris Angina pectoris, often just called angina, is extreme discomfort in your chest, neck, or arm caused by a lack of blood in the middle and thickest layer of your heart wall (myocardium). It may feel like tightness or heavy pressure. It may feel like a crushing or squeezing pain. Some people say it feels like gas or indigestion. It may go down your shoulders, back, and arms. Some people may have symptoms other than pain. These symptoms include fatigue, shortness of breath, cold sweats, or nausea. There are four different types of angina:  Stable angina--Stable angina usually occurs in episodes of predictable frequency and duration. It usually is brought on by physical activity, emotional stress, or excitement. These are all times when the myocardium needs more oxygen. Stable angina usually lasts a few minutes and often is relieved by taking a medicine that can be taken under your tongue (sublingually). The medicine is called nitroglycerin. Stable angina is  caused by a buildup of plaque inside the arteries, which restricts blood flow to the heart muscle (atherosclerosis).  Unstable angina--Unstable angina can occur even when your body experiences little or no physical exertion. It can occur during sleep. It can also occur at rest. It can suddenly increase in severity or frequency. It might not be relieved by sublingual nitroglycerin. It can last up to 30 minutes. The most common cause of unstable angina is a blood clot that has developed on the top of plaque buildup inside a coronary artery. It can lead to a heart  attack if the blood clot completely blocks the artery.  Microvascular angina--This type of angina is caused by a disorder of tiny blood vessels called arterioles. Microvascular angina is more common in women. The pain may be more severe and last longer than other types of angina pectoris.  Prinzmetal or variant angina--This type of angina pectoris usually occurs when your body experiences little or no physical exertion. It especially occurs in the early morning hours. It is caused by a spasm of your coronary artery. HOME CARE INSTRUCTIONS   Only take over-the-counter and prescription medicines as directed by your health care provider.  Stay active or increase your exercise as directed by your health care provider.  Limit strenuous activity as directed by your health care provider.  Limit heavy lifting as directed by your health care provider.  Maintain a healthy weight.  Learn about and eat heart-healthy foods.  Do not use any tobacco products including cigarettes, chewing tobacco or electronic cigarettes. SEEK IMMEDIATE MEDICAL CARE IF:  You experience the following symptoms:  Chest, neck, deep shoulder, or arm pain or discomfort that lasts more than a few minutes.  Chest, neck, deep shoulder, or arm pain or discomfort that goes away and comes back, repeatedly.  Heavy sweating with discomfort, without a noticeable cause.  Shortness of breath or difficulty breathing.  Angina that does not get better after a few minutes of rest or after taking sublingual nitroglycerin. These can all be symptoms of a heart attack, which is a medical emergency! Get medical help at once. Call your local emergency service (911 in [place] immediately. Do not  drive yourself to the hospital and do not  wait to for your symptoms to go away. MAKE SURE YOU:  Understand these instructions.  Will watch your condition.  Will get help right away if you are not doing well or get worse. Document Released:  08/08/2005 Document Revised: 08/13/2013 Document Reviewed: 12/10/2013 [facility] Patient Information 2015 [place], [place]. This information is not intended to replace advice given to you by your health care provider. Make sure you discuss any questions you have with your health care provider.

## 2014-08-30 NOTE — Progress Notes (Signed)
CARDIAC REHAB PHASE I   PRE:  Rate/Rhythm: 76 NSR with occ PVC  BP:  Sitting: 141/78      SaO2: 98 RA  MODE:  Ambulation: 650 ft   POST:  Rate/Rhythm: 88 NSR  BP:  Sitting: 138/77     SaO2: 99 RA  0815-0900 Patient ambulated in hallway unassisted with steady gait. No complaints noted. Post walk patient back to sitting on side of bed with call bell in reach. Education completed with concentration on smoking cessation and antiplatelet therapy compliance. Information also given on exercise and diet modification. Teach back noted. Patient did express interest in phase II cardiac rehab, but does not have insurance at present. Just started new job and is waiting on insurance coverage. Also working with Child psychotherapistsocial worker for medications and places to live. She was evicted 2 weeks ago but states she is able to live with her father until other arrangements can be made. Financial assistance forms given for patient to fill out. With her permission, order placed for phase II.  Maude LerichePayne, Holger Sokolowski EnglishRN, BSN 08/30/2014 9:01 AM

## 2014-08-30 NOTE — Progress Notes (Signed)
SUBJECTIVE: Pt doing well this morning. Denies chest pain and shortness of breath. Radial cath site minimally tender.     Intake/Output Summary (Last 24 hours) at 08/30/14 0811 Last data filed at 08/30/14 1610  Gross per 24 hour  Intake 1235.07 ml  Output   1300 ml  Net -64.93 ml    Current Facility-Administered Medications  Medication Dose Route Frequency Provider Last Rate Last Dose  . acetaminophen (TYLENOL) tablet 650 mg  650 mg Oral Q4H PRN Nani Ravens, MD   650 mg at 08/29/14 2131  . adenosine (ADENOCARD) 12 MG/4ML injection 48 mg  16 mL Intravenous Once Corky Crafts, MD      . albuterol (PROVENTIL) (2.5 MG/3ML) 0.083% nebulizer solution 2.5 mg  2.5 mg Inhalation Q6H PRN Nani Ravens, MD      . aspirin EC tablet 81 mg  81 mg Oral Daily Nani Ravens, MD   81 mg at 08/28/14 0935  . clopidogrel (PLAVIX) tablet 75 mg  75 mg Oral Q breakfast Corky Crafts, MD   75 mg at 08/30/14 0751  . gi cocktail (Maalox,Lidocaine,Donnatal)  30 mL Oral QID PRN Nani Ravens, MD      . heparin injection 5,000 Units  5,000 Units Subcutaneous 3 times per day Corky Crafts, MD   5,000 Units at 08/30/14 [phone]  . ondansetron (ZOFRAN) injection 4 mg  4 mg Intravenous Q6H PRN Nani Ravens, MD      . pantoprazole (PROTONIX) EC tablet 40 mg  40 mg Oral Daily Pincus Large, DO   40 mg at 08/29/14 5409  . rosuvastatin (CRESTOR) tablet 40 mg  40 mg Oral Daily Corky Crafts, MD   40 mg at 08/29/14 2131    Filed Vitals:   08/29/14 2000 08/30/14 0015 08/30/14 0600 08/30/14 0747  BP: 112/68 102/61 129/51 145/63  Pulse: 73 77 66 69  Temp: 97.3 F (36.3 C) 98.1 F (36.7 C) 97.7 F (36.5 C) 97.8 F (36.6 C)  TempSrc: Oral Oral Oral Oral  Resp: Height:      Weight:  248 lb 10.9 oz (112.8 kg)    SpO2: 99% 100% 95% 96%    PHYSICAL EXAM General: NAD HEENT: Normal. Neck: No JVD, no thyromegaly.  Lungs: Clear to auscultation bilaterally with normal  respiratory effort. CV: Nondisplaced PMI.  Regular rate and rhythm, normal S1/S2, no S3/S4, no murmur.  No pretibial edema.  No carotid bruit.  Normal pedal pulses.  Abdomen: Soft, nontender, obese, no distention.  Neurologic: Alert and oriented x 3.  Psych: Normal affect. Musculoskeletal: No gross deformities. Extremities: No clubbing or cyanosis.   TELEMETRY: Reviewed telemetry pt in normal sinus rhythm.  LABS: Basic Metabolic Panel:  Recent Labs  81/19/14 1240 08/29/14 2010 08/30/14 0307  NA 137  --  137  K 4.0  --  3.8  CL 100  --  104  CO2 26  --  22  GLUCOSE 95  --  110*  BUN 9  --  9  CREATININE 0.84 0.98 1.01  CALCIUM 10.3  --  8.6   Liver Function Tests: No results for input(s): AST, ALT, ALKPHOS, BILITOT, PROT, ALBUMIN in the last 72 hours. No results for input(s): LIPASE, AMYLASE in the last 72 hours. CBC:  Recent Labs  08/29/14 2010 08/29/14 2200 08/30/14 0307  WBC 5.9  --  5.5  HGB 11.6*  --  11.1*  HCT 35.1*  --  33.5*  MCV 98.9  --  99.4  PLT 254 251 237   Cardiac Enzymes:  Recent Labs  08/27/14 1821 08/28/14 0012 08/28/14 0825  TROPONINI <0.03 <0.03 <0.03   BNP: Invalid input(s): POCBNP D-Dimer: No results for input(s): DDIMER in the last 72 hours. Hemoglobin A1C: No results for input(s): HGBA1C in the last 72 hours. Fasting Lipid Panel: No results for input(s): CHOL, HDL, LDLCALC, TRIG, CHOLHDL, LDLDIRECT in the last 72 hours. Thyroid Function Tests: No results for input(s): TSH, T4TOTAL, T3FREE, THYROIDAB in the last 72 hours.  Invalid input(s): FREET3 Anemia Panel: No results for input(s): VITAMINB12, FOLATE, FERRITIN, TIBC, IRON, RETICCTPCT in the last 72 hours.  RADIOLOGY: Dg Chest 2 View  08/27/2014   CLINICAL DATA:  Cough for 3 weeks.  Mid chest pain.  EXAM: CHEST  2 VIEW  COMPARISON:  05/08/2014  FINDINGS: The heart size and mediastinal contours are within normal limits. Both lungs are clear. The visualized skeletal  structures are unremarkable.  IMPRESSION: No active cardiopulmonary disease.   Electronically Signed   By: Myles RosenthalJohn  Stahl M.D.   On: 08/27/2014 13:49   Ct Angio Chest Pe W/cm &/or Wo Cm  08/27/2014   CLINICAL DATA:  Central chest pain with shortness of breath and nausea while driving to work.  EXAM: CT ANGIOGRAPHY CHEST WITH CONTRAST  TECHNIQUE: Multidetector CT imaging of the chest was performed using the standard protocol during bolus administration of intravenous contrast. Multiplanar CT image reconstructions and MIPs were obtained to evaluate the vascular anatomy.  CONTRAST:  1 OMNIPAQUE IOHEXOL 300 MG/ML SOLN, 100mL OMNIPAQUE IOHEXOL 350 MG/ML SOLN  COMPARISON:  08/27/2014 chest radiograph  FINDINGS: No filling defect is identified in the pulmonary arterial tree to suggest pulmonary embolus.  Ascending thoracic aortic diameter 4.0 cm. Mild atherosclerotic calcification in the aortic arch. Left anterior descending coronary artery atherosclerotic calcification. Borderline enlargement of the left ventricle.  No pathologic thoracic adenopathy.  Mild central airway thickening noted.  Otherwise lungs appear clear.  Review of the MIP images confirms the above findings.  IMPRESSION: 1. No filling defect is identified in the pulmonary arterial tree to suggest pulmonary embolus. 2. Airway thickening is present, suggesting bronchitis or reactive airways disease. 3. Coronary artery atherosclerosis. 4. 4.0 cm in diameter ascending thoracic aortic aneurysm. Recommend annual imaging followup by CTA or MRA. This recommendation follows 2010 ACCF/AHA/AATS/ACR/ASA/SCA/SCAI/SIR/STS/SVM Guidelines for the Diagnosis and Management of Patients With Thoracic Aortic Disease. Circulation. 2010; 121: Z610-R604: [id number]   Electronically Signed   By: Herbie BaltimoreWalt  Liebkemann M.D.   On: 08/27/2014 15:06      ASSESSMENT AND PLAN: 1. CAD s/p PCI of mid LAD with DES: Symptomatically stable. Continue dual antiplatelet therapy with ASA and Plavix for 1  year. Continue statin therapy. 2. Essential HTN: Mildly elevated this morning. Resume therapy with lisinopril. 3. Hyperlipidemia: Continue Crestor. 4. Ascending thoracic aortic aneurysm: Measured 4 cm by CT. Will need annual surveillance monitoring. 5. Tobacco abuse: Cessation advised.  Dispo: Stable for discharge from a cardiac perspective. Will arrange outpatient follow up.  Prentice DockerSuresh Koneswaran, M.D., F.A.C.C.

## 2014-08-30 NOTE — Progress Notes (Signed)
TR BAND REMOVAL  LOCATION:    right radial  DEFLATED PER PROTOCOL:    Yes.    TIME BAND OFF / DRESSING APPLIED:    21:00 (08/29/14)  SITE UPON ARRIVAL:    Level 0  SITE AFTER BAND REMOVAL:    Level 0  REVERSE ALLEN'S TEST:     positive  CIRCULATION SENSATION AND MOVEMENT:    Within Normal Limits   Yes.    COMMENTS:

## 2014-08-30 NOTE — Progress Notes (Signed)
Pt discharged with father via wheelchair.  All belongings with patient.  Discharge teaching and instructions given to patient and questions answered.  Pt in nad with stable vs, no chest pain or sob.  Pt understands about taking plavix without fail and that prescription is called in to [facility] on [address].

## 2014-08-31 NOTE — Care Management Note (Signed)
    Page 1 of 1   08/31/2014     10:34:17 AM CARE MANAGEMENT NOTE 08/31/2014  Patient:  Holly Dunn, Holly Dunn   Account Number:  [id number]  Date Initiated:  08/30/2014  Documentation initiated by:  [facility]  Subjective/Objective Assessment:   adm: SOB      Action/Plan:   discharge planning   Anticipated DC Date:  08/30/2014   Anticipated DC Plan:  [place]  CM consult  [place] Program  Medication Assistance      Choice offered to / List presented to:             Status of service:  Completed, signed off Medicare Important Message given?   (If response is "NO", the following Medicare IM given date fields will be blank) Date Medicare IM given:   Medicare IM given by:   Date Additional Medicare IM given:   Additional Medicare IM given by:    Discharge Disposition:  HOME/SELF CARE  Per UR Regulation:    If discussed at Long Length of Stay Meetings, dates discussed:    Comments:  08/30/14 09:00 CM met with pt and gave pt [place] letter and list of participating pharmacies.  Pt verbalized understanding of MATCH parameters.  CM gave pt pamphlet for [facility] and pt verbalized understanding she is to go to the clinice any weekday of this week from 9-10am and ask for: AN APPOINTMENT FOR A PCP; AN APPOINTMENT WITH A NAVIGATOR TO SECURE INSURANCE; AN APPOINTMENT FOR A FOLLOW UP MEDICAL APPOINTMENT.  No other CM needs were communicated.  Mariane Masters, BSN, CM [phone].

## 2014-09-01 LAB — POCT ACTIVATED CLOTTING TIME
ACTIVATED CLOTTING TIME: 275 s
Activated Clotting Time: 251 seconds

## 2014-09-03 ENCOUNTER — Encounter: Payer: Self-pay | Admitting: Family Medicine

## 2014-09-03 ENCOUNTER — Ambulatory Visit (INDEPENDENT_AMBULATORY_CARE_PROVIDER_SITE_OTHER): Payer: Self-pay | Admitting: Family Medicine

## 2014-09-03 VITALS — BP 138/82 | HR 82 | Temp 97.8°F | Ht 66.0 in | Wt 249.5 lb

## 2014-09-03 DIAGNOSIS — I5030 Unspecified diastolic (congestive) heart failure: Secondary | ICD-10-CM | POA: Insufficient documentation

## 2014-09-03 DIAGNOSIS — E785 Hyperlipidemia, unspecified: Secondary | ICD-10-CM

## 2014-09-03 DIAGNOSIS — D649 Anemia, unspecified: Secondary | ICD-10-CM | POA: Insufficient documentation

## 2014-09-03 DIAGNOSIS — I2510 Atherosclerotic heart disease of native coronary artery without angina pectoris: Secondary | ICD-10-CM

## 2014-09-03 DIAGNOSIS — I10 Essential (primary) hypertension: Secondary | ICD-10-CM

## 2014-09-03 DIAGNOSIS — I5032 Chronic diastolic (congestive) heart failure: Secondary | ICD-10-CM

## 2014-09-03 DIAGNOSIS — Z720 Tobacco use: Secondary | ICD-10-CM

## 2014-09-03 LAB — CBC WITH DIFFERENTIAL/PLATELET
BASOS ABS: 0.1 10*3/uL (ref 0.0–0.1)
Basophils Relative: 1 % (ref 0–1)
EOS PCT: 5 % (ref 0–5)
Eosinophils Absolute: 0.3 10*3/uL (ref 0.0–0.7)
HCT: 38 % (ref 36.0–46.0)
Hemoglobin: 13.3 g/dL (ref 12.0–15.0)
LYMPHS ABS: 1.8 10*3/uL (ref 0.7–4.0)
Lymphocytes Relative: 29 % (ref 12–46)
MCH: 33.9 pg (ref 26.0–34.0)
MCHC: 35 g/dL (ref 30.0–36.0)
MCV: 96.9 fL (ref 78.0–100.0)
MPV: 10 fL (ref 8.6–12.4)
Monocytes Absolute: 0.4 10*3/uL (ref 0.1–1.0)
Monocytes Relative: 6 % (ref 3–12)
Neutro Abs: 3.6 10*3/uL (ref 1.7–7.7)
Neutrophils Relative %: 59 % (ref 43–77)
PLATELETS: 332 10*3/uL (ref 150–400)
RBC: 3.92 MIL/uL (ref 3.87–5.11)
RDW: 13.7 % (ref 11.5–15.5)
WBC: 6.1 10*3/uL (ref 4.0–10.5)

## 2014-09-03 LAB — BASIC METABOLIC PANEL
BUN: 7 mg/dL (ref 6–23)
CO2: 25 meq/L (ref 19–32)
CREATININE: 0.82 mg/dL (ref 0.50–1.10)
Calcium: 9.6 mg/dL (ref 8.4–10.5)
Chloride: 104 mEq/L (ref 96–112)
Glucose, Bld: 90 mg/dL (ref 70–99)
Potassium: 4.3 mEq/L (ref 3.5–5.3)
SODIUM: 139 meq/L (ref 135–145)

## 2014-09-03 LAB — VITAMIN B12: Vitamin B-12: 336 pg/mL (ref 211–911)

## 2014-09-03 LAB — FOLATE: Folate: 4.6 ng/mL

## 2014-09-03 MED ORDER — FUROSEMIDE 40 MG PO TABS: 40.0000 mg | ORAL_TABLET | Freq: Every day | ORAL | Status: DC

## 2014-09-03 MED ORDER — CARVEDILOL 3.125 MG PO TABS: 3.1250 mg | ORAL_TABLET | Freq: Two times a day (BID) | ORAL | Status: AC

## 2014-09-03 NOTE — Assessment & Plan Note (Signed)
Reasonable today, however given recent catheter results will treat slightly more aggressively Start Coreg, continue lisinopril Follow-up with PCP

## 2014-09-03 NOTE — Assessment & Plan Note (Signed)
With catheter proven CAD status post DES stent in the LAD  Continue Crestor

## 2014-09-03 NOTE — Assessment & Plan Note (Signed)
Some in her mid chest pain most likely due to cardiac nature as it's resolving with nitroglycerin, however also has costochondritis Continue aspirin and Plavix with recent DES in the LAD Started Coreg today Has cardiology follow-up in one week Discussed red flags, follow-up with PCP and cardiology as scheduled

## 2014-09-03 NOTE — Progress Notes (Signed)
Patient ID: Holly Dunn, female   DOB: 1965/12/27, 49 y.o.   MRN: [id number]   HPI  Patient presents today for hospital follow-up  Patient was admitted to hospital on [facility] 02/09/2015 with chest pain. During hospitalization a cardiac catheterization was done and she was found have 70% occlusion of her LAD so a DES stent was placed.  Since leaving the hospital her chest pain has not changed much. She states she has had one or 2 episodes since leaving may seem to be resolved with changing position, but they're also resolved with nitroglycerin.  She denies dyspnea at rest today. She does have exertional dyspnea which she states is stable for several months. She also has orthopnea which she states is stable for several months.  She's taking all of her medications specifically including aspirin, Plavix, lisinopril, and Crestor  Smoking status noted-has completely quit now ROS: Per HPI  Objective: BP 138/82 mmHg  Pulse 82  Temp(Src) 97.8 F (36.6 C) (Oral)  Ht 5\' 6"  (1.676 m)  Wt 249 lb 8 oz (113.172 kg)  BMI 40.29 kg/m2  LMP 07/02/2013 Gen: NAD, alert, cooperative with exam HEENT: NCAT CV: RRR, good S1/S2, no murmur Resp: CTABL, no wheezes, non-labored Ext: No edema, warm Neuro: Alert and oriented, No gross deficits  Assessment and plan:  Normocytic anemia Mild normocytic anemia MCV 99, borderline normocytic/macrocytic Check folate, B-12, repeat CBC   Tobacco abuse Currently has quit smoking Encouraged and congratulated   Hyperlipidemia LDL goal <100 With catheter proven CAD status post DES stent in the LAD  Continue Crestor   Diastolic CHF Mild diastolic CHF with grade 1 diastolic dysfunction shown on recent echo With her orthopnea and exertional dyspnea will  trial Lasix Checking renal function today, discussed with her that she should see improvement in a few days of diuresis and she has close follow-up so we can decide if that should be continued or not.     Coronary artery disease, non-occlusive Some in her mid chest pain most likely due to cardiac nature as it's resolving with nitroglycerin, however also has costochondritis Continue aspirin and Plavix with recent DES in the LAD Started Coreg today Has cardiology follow-up in one week Discussed red flags, follow-up with PCP and cardiology as scheduled   Benign essential HTN Reasonable today, however given recent catheter results will treat slightly more aggressively Start Coreg, continue lisinopril Follow-up with PCP    Orders Placed This Encounter  Procedures  . Basic Metabolic Panel  . CBC with Differential  . Folate  . Vitamin B12    Meds ordered this encounter  Medications  . carvedilol (COREG) 3.125 MG tablet    Sig: Take 1 tablet (3.125 mg total) by mouth 2 (two) times daily with a meal.    Dispense:  60 tablet    Refill:  3  . furosemide (LASIX) 40 MG tablet    Sig: Take 1 tablet (40 mg total) by mouth daily.    Dispense:  30 tablet    Refill:  0

## 2014-09-03 NOTE — Assessment & Plan Note (Signed)
Currently has quit smoking Encouraged and congratulated

## 2014-09-03 NOTE — Patient Instructions (Signed)
Great to meet you   Start cored 1 pill twice daily Start Lasix once daily first thing in the morning, if this helps your breathing we will continue it.   Angina Pectoris Angina pectoris, often just called angina, is extreme discomfort in your chest, neck, or arm caused by a lack of blood in the middle and thickest layer of your heart wall (myocardium). It may feel like tightness or heavy pressure. It may feel like a crushing or squeezing pain. Some people say it feels like gas or indigestion. It may go down your shoulders, back, and arms. Some people may have symptoms other than pain. These symptoms include fatigue, shortness of breath, cold sweats, or nausea. There are four different types of angina:  Stable angina--Stable angina usually occurs in episodes of predictable frequency and duration. It usually is brought on by physical activity, emotional stress, or excitement. These are all times when the myocardium needs more oxygen. Stable angina usually lasts a few minutes and often is relieved by taking a medicine that can be taken under your tongue (sublingually). The medicine is called nitroglycerin. Stable angina is caused by a buildup of plaque inside the arteries, which restricts blood flow to the heart muscle (atherosclerosis).  Unstable angina--Unstable angina can occur even when your body experiences little or no physical exertion. It can occur during sleep. It can also occur at rest. It can suddenly increase in severity or frequency. It might not be relieved by sublingual nitroglycerin. It can last up to 30 minutes. The most common cause of unstable angina is a blood clot that has developed on the top of plaque buildup inside a coronary artery. It can lead to a heart attack if the blood clot completely blocks the artery.  Microvascular angina--This type of angina is caused by a disorder of tiny blood vessels called arterioles. Microvascular angina is more common in women. The pain may be more  severe and last longer than other types of angina pectoris.  Prinzmetal or variant angina--This type of angina pectoris usually occurs when your body experiences little or no physical exertion. It especially occurs in the early morning hours. It is caused by a spasm of your coronary artery. HOME CARE INSTRUCTIONS   Only take over-the-counter and prescription medicines as directed by your health care provider.  Stay active or increase your exercise as directed by your health care provider.  Limit strenuous activity as directed by your health care provider.  Limit heavy lifting as directed by your health care provider.  Maintain a healthy weight.  Learn about and eat heart-healthy foods.  Do not use any tobacco products including cigarettes, chewing tobacco or electronic cigarettes. SEEK IMMEDIATE MEDICAL CARE IF:  You experience the following symptoms:  Chest, neck, deep shoulder, or arm pain or discomfort that lasts more than a few minutes.  Chest, neck, deep shoulder, or arm pain or discomfort that goes away and comes back, repeatedly.  Heavy sweating with discomfort, without a noticeable cause.  Shortness of breath or difficulty breathing.  Angina that does not get better after a few minutes of rest or after taking sublingual nitroglycerin. These can all be symptoms of a heart attack, which is a medical emergency! Get medical help at once. Call your local emergency service (911 in [place] immediately. Do not  drive yourself to the hospital and do not  wait to for your symptoms to go away. MAKE SURE YOU:  Understand these instructions.  Will watch your condition.  Will get  help right away if you are not doing well or get worse. Document Released: 08/08/2005 Document Revised: 08/13/2013 Document Reviewed: 12/10/2013 [facility] Patient Information 2015 [place], [place]. This information is not intended to replace advice given to you by your health care provider. Make sure you discuss  any questions you have with your health care provider.

## 2014-09-03 NOTE — Assessment & Plan Note (Signed)
Mild normocytic anemia MCV 99, borderline normocytic/macrocytic Check folate, B-12, repeat CBC

## 2014-09-03 NOTE — Assessment & Plan Note (Signed)
Mild diastolic CHF with grade 1 diastolic dysfunction shown on recent echo With her orthopnea and exertional dyspnea will  trial Lasix Checking renal function today, discussed with her that she should see improvement in a few days of diuresis and she has close follow-up so we can decide if that should be continued or not.

## 2014-09-09 ENCOUNTER — Encounter: Payer: Self-pay | Admitting: Physician Assistant

## 2014-09-09 ENCOUNTER — Ambulatory Visit (INDEPENDENT_AMBULATORY_CARE_PROVIDER_SITE_OTHER): Payer: Self-pay | Admitting: Physician Assistant

## 2014-09-09 ENCOUNTER — Ambulatory Visit
Admission: RE | Admit: 2014-09-09 | Discharge: 2014-09-09 | Disposition: A | Discharge status: Home / Self Care | Payer: Self-pay | Source: Ambulatory Visit | Attending: Physician Assistant | Admitting: Physician Assistant

## 2014-09-09 VITALS — BP 104/62 | HR 70 | Ht 66.0 in | Wt 250.9 lb

## 2014-09-09 DIAGNOSIS — I2510 Atherosclerotic heart disease of native coronary artery without angina pectoris: Secondary | ICD-10-CM

## 2014-09-09 DIAGNOSIS — R0600 Dyspnea, unspecified: Secondary | ICD-10-CM

## 2014-09-09 DIAGNOSIS — Z79899 Other long term (current) drug therapy: Secondary | ICD-10-CM

## 2014-09-09 DIAGNOSIS — Z720 Tobacco use: Secondary | ICD-10-CM

## 2014-09-09 DIAGNOSIS — I7121 Aneurysm of the ascending aorta, without rupture: Secondary | ICD-10-CM

## 2014-09-09 DIAGNOSIS — I10 Essential (primary) hypertension: Secondary | ICD-10-CM

## 2014-09-09 DIAGNOSIS — I712 Thoracic aortic aneurysm, without rupture: Secondary | ICD-10-CM

## 2014-09-09 DIAGNOSIS — E785 Hyperlipidemia, unspecified: Secondary | ICD-10-CM

## 2014-09-09 LAB — BASIC METABOLIC PANEL
BUN: 13 mg/dL (ref 6–23)
CALCIUM: 9.9 mg/dL (ref 8.4–10.5)
CO2: 22 meq/L (ref 19–32)
Chloride: 97 mEq/L (ref 96–112)
Creat: 0.87 mg/dL (ref 0.50–1.10)
Glucose, Bld: 94 mg/dL (ref 70–99)
Potassium: 4.5 mEq/L (ref 3.5–5.3)
Sodium: 132 mEq/L — ABNORMAL LOW (ref 135–145)

## 2014-09-09 NOTE — Assessment & Plan Note (Signed)
I am not sure the etiology of her dyspnea. Is not worse with exertion.  It just comes on when she sitting there. Her O2 saturations are 98% while in the office here.  She is not taking Brilinta which can cause dyspnea. She appears euvolemic on exam with clear lungs, no JVD and no lower extremity edema.  She had a CT angiogram of her chest during hospitalization which was negative for PE and lower extremity venous Dopplers which were negative for DVT. Her EKG shows normal sinus rhythm with a rate of 70 bpm.  She was to have a basic metabolic panel on Friday but do that today to check her kidney function. I am also going to check chest x-ray.  She is due to follow up with her primary care provider this week we will see her back in one month.

## 2014-09-09 NOTE — Assessment & Plan Note (Signed)
Blood pressure is a little bit on the low side today no changes in therapy. Depending on lab results and chest x-ray and may decrease or hold her Lasix.

## 2014-09-09 NOTE — Assessment & Plan Note (Addendum)
Currently on a statin. Lipid Panel     Component Value Date/Time   CHOL 304* 07/11/2014 1445   TRIG 258* 07/11/2014 1445   HDL 38* 07/11/2014 1445   CHOLHDL 8.0 07/11/2014 1445   VLDL 52* 07/11/2014 1445   LDLCALC 214* 07/11/2014 1445    Discussed low carbohydrate diet.  Should recheck her lipids in 3 months.

## 2014-09-09 NOTE — Assessment & Plan Note (Signed)
Reports not smoking since her discharge.

## 2014-09-09 NOTE — Assessment & Plan Note (Signed)
Status post drug eluding stent to the LAD. No further angina. On aspirin Plavix Coreg

## 2014-09-09 NOTE — Assessment & Plan Note (Signed)
Need annual monitoring

## 2014-09-09 NOTE — Patient Instructions (Signed)
Your physician recommends that you schedule a follow-up appointment in:1 month with Dr.Croitoru  Your physician recommends that you return for lab work   A chest x-ray takes a picture of the organs and structures inside the chest, including the heart, lungs, and blood vessels. This test can show several things, including, whether the heart is enlarges; whether fluid is building up in the lungs; and whether pacemaker / defibrillator leads are still in place.

## 2014-09-09 NOTE — Progress Notes (Signed)
Patient ID: Holly Dunn, female   DOB: Sep 15, 1965, 49 y.o.   MRN: [id number]    Date:  09/09/2014   ID:  [name], DOB [birth year], MRN [id number]  PCP:  Kathee Delton, MD  Primary Cardiologist:  Croitoru([place])   Chief Complaint  Patient presents with  . Follow-up    chest pressure , having trouble taking a deep breath.      History of Present Illness: Holly Dunn is a 49 y.o. female history of hypertension, tobacco abuse, coronary artery disease, hyperlipidemia, heart murmur.  She was recently hospitalized and underwent coronary angiography and stenting of the mid LAD with a drug-eluting stent.  She underwent CT angiogram of the chest which showed no pulmonary embolism. She does have a 4 cm ascending thoracic aortic aneurysm which will need annual follow-up.  It also revealed airway thickening suggesting bronchitis or reactive airway disease. She also had lower extremity venous Dopplers which were negative for DVT.  Patient presents today for posthospital follow-up. She reports dyspnea.  She says she just can't catch her breath and she can be sitting there and will need to take a deep breath. She also reports 2 pillow orthopnea but denies lower extremity edema. She's gained approximately 2 pounds since discharge.  She does use albuterol inhaler but does not notice any improvement.  She was started on aspirin and Plavix post cardiac cath.  She also reports some dizziness upon standing and was recently seen by her primary care provider who started her on Lasix for diastolic heart failure.  The patient currently denies nausea, vomiting, fever, chest pain, PND, cough, congestion, abdominal pain, hematochezia, melena, lower extremity edema, claudication.  Wt Readings from Last 3 Encounters:  09/09/14 250 lb 14.4 oz (113.807 kg)  09/03/14 249 lb 8 oz (113.172 kg)  08/30/14 248 lb 10.9 oz (112.8 kg)     Past Medical History  Diagnosis Date  . Benign essential HTN 01-31-12  .  Tobacco abuse 01/26/2012    2ppd from 1998-2008. Stopped 2008-2011. Restarted after death of husband. As of January 31, 2012, smoking 1ppd. Approximately 25 pack year history. Would like to quit.   . Coronary artery disease, non-occlusive 01/26/2012    Cardiac cath in 2009 performed by Dr. Virgel Manifold in [place], [place]. 40% stenosis of LAD proximal to 2nd diagonal. EF 55%  . Hyperlipidemia LDL goal < 100 02/17/2012  . Amaurosis fugax of right eye 2011    "~ 15""  . Heart murmur   . Sleep apnea     "left mask in [place] in 2012; haven't got new one" (08/27/2013)  . GERD (gastroesophageal reflux disease)   . Migraine     "probably 3 times/month" (08/27/2014)  . Arthritis     "knees, right ankle, neck" (08/27/2014)  . Depression     "lately" (08/27/2014)    Current Outpatient Prescriptions  Medication Sig Dispense Refill  . albuterol (PROVENTIL HFA;VENTOLIN HFA) 108 (90 BASE) MCG/ACT inhaler Inhale 1-2 puffs into the lungs every 6 (six) hours as needed for wheezing or shortness of breath. 1 Inhaler 0  . aspirin EC 81 MG tablet Take 81 mg by mouth daily.    . carvedilol (COREG) 3.125 MG tablet Take 1 tablet (3.125 mg total) by mouth 2 (two) times daily with a meal. 60 tablet 3  . clopidogrel (PLAVIX) 75 MG tablet Take 1 tablet (75 mg total) by mouth daily with breakfast. 30 tablet 6  . Fish Oil-Cholecalciferol (FISH OIL/D3 ADULT GUMMIES) 184-250 MG-UNIT CHEW  Chew 2 capsules by mouth daily.    . furosemide (LASIX) 40 MG tablet Take 1 tablet (40 mg total) by mouth daily. 30 tablet 0  . lisinopril (PRINIVIL,ZESTRIL) 20 MG tablet Take 1 tablet (20 mg total) by mouth daily. 30 tablet 5  . Multiple Vitamin (MULTIVITAMIN) tablet Take 1 tablet by mouth daily.    . nitroGLYCERIN (NITROSTAT) 0.4 MG SL tablet Place 1 tablet (0.4 mg total) under the tongue every 5 (five) minutes as needed for chest pain. 25 tablet 3  . rosuvastatin (CRESTOR) 40 MG tablet Take 40 mg by mouth daily.     No current facility-administered  medications for this visit.    Allergies:   No Known Allergies  Social History:  The patient  reports that she has been smoking Cigarettes.  She has a 12.5 pack-year smoking history. She has never used smokeless tobacco. She reports that she drinks alcohol. She reports that she does not use illicit drugs.   Family history:   Family History  Problem Relation Age of Onset  . Heart disease Mother 44    First MI  . Hypertension Mother   . Kidney disease Mother   . Hyperlipidemia Mother   . Diabetes Mother   . Diabetes Father   . Hypertension Father   . Heart disease Maternal Grandmother   . Heart disease Maternal Grandfather   . Heart disease Paternal Grandmother   . Heart disease Paternal Grandfather     ROS:  Please see the history of present illness.  All other systems reviewed and negative.   PHYSICAL EXAM: VS:  BP 104/62 mmHg  Pulse 70  Ht  (1.676 m)  Wt 250 lb 14.4 oz (113.807 kg)  BMI 40.52 kg/m2  LMP 07/02/2013 Obese, well developed, in no acute distress HEENT: Pupils are equal round react to light accommodation extraocular movements are intact.  Neck: no JVDNo cervical lymphadenopathy. Cardiac: Regular rate and rhythm 1/6 systolic murmur Lungs:  clear to auscultation bilaterally, no wheezing, rhonchi or rales Abd: soft, nontender, positive bowel sounds all quadrants, no hepatosplenomegaly Ext: no lower extremity edema.  2+ radial and dorsalis pedis pulses. Skin: warm and dry Neuro:  Grossly normal  EKG: Sinus rhythm rate 70 bpm  ASSESSMENT AND PLAN:  Problem List Items Addressed This Visit    Tobacco abuse (Chronic)    Reports not smoking since her discharge.      Thoracic ascending aortic aneurysm    Need annual monitoring      Hyperlipidemia LDL goal <100 (Chronic)    Currently on a statin. Lipid Panel     Component Value Date/Time   CHOL 304* 07/11/2014 1445   TRIG 258* 07/11/2014 1445   HDL 38* 07/11/2014 1445   CHOLHDL 8.0 07/11/2014 1445    VLDL 52* 07/11/2014 1445   LDLCALC 214* 07/11/2014 1445    Discussed low carbohydrate diet.  Should recheck her lipids in 3 months.      Dyspnea - Primary    I am not sure the etiology of her dyspnea. Is not worse with exertion.  It just comes on when she sitting there. Her O2 saturations are 98% while in the office here.  She is not taking Brilinta which can cause dyspnea. She appears euvolemic on exam with clear lungs, no JVD and no lower extremity edema.  She had a CT angiogram of her chest during hospitalization which was negative for PE and lower extremity venous Dopplers which were negative for DVT. Her EKG  shows normal sinus rhythm with a rate of 70 bpm.  She was to have a basic metabolic panel on Friday but do that today to check her kidney function. I am also going to check chest x-ray.  She is due to follow up with her primary care provider this week we will see her back in one month.      Relevant Orders   EKG 12-Lead   DG Chest 2 View   Coronary artery disease, non-occlusive (Chronic)    Status post drug eluding stent to the LAD. No further angina. On aspirin Plavix Coreg      Benign essential HTN (Chronic)    Blood pressure is a little bit on the low side today no changes in therapy. Depending on lab results and chest x-ray and may decrease or hold her Lasix.       Other Visit Diagnoses    Medication management        Relevant Orders    Basic Metabolic Panel (BMET)

## 2014-09-10 ENCOUNTER — Encounter: Payer: Self-pay | Admitting: Physician Assistant

## 2014-09-12 ENCOUNTER — Ambulatory Visit: Payer: Self-pay | Admitting: Family Medicine

## 2014-09-13 ENCOUNTER — Encounter (HOSPITAL_COMMUNITY): Payer: Self-pay | Admitting: Nurse Practitioner

## 2014-09-13 ENCOUNTER — Emergency Department (HOSPITAL_COMMUNITY)
Admission: EM | Admit: 2014-09-13 | Discharge: 2014-09-13 | Disposition: A | Discharge status: Home / Self Care | Payer: Self-pay | Attending: Emergency Medicine | Admitting: Emergency Medicine

## 2014-09-13 DIAGNOSIS — W57XXXA Bitten or stung by nonvenomous insect and other nonvenomous arthropods, initial encounter: Secondary | ICD-10-CM | POA: Insufficient documentation

## 2014-09-13 DIAGNOSIS — M1990 Unspecified osteoarthritis, unspecified site: Secondary | ICD-10-CM | POA: Insufficient documentation

## 2014-09-13 DIAGNOSIS — I10 Essential (primary) hypertension: Secondary | ICD-10-CM | POA: Insufficient documentation

## 2014-09-13 DIAGNOSIS — Z79899 Other long term (current) drug therapy: Secondary | ICD-10-CM | POA: Insufficient documentation

## 2014-09-13 DIAGNOSIS — Z7902 Long term (current) use of antithrombotics/antiplatelets: Secondary | ICD-10-CM | POA: Insufficient documentation

## 2014-09-13 DIAGNOSIS — L089 Local infection of the skin and subcutaneous tissue, unspecified: Secondary | ICD-10-CM | POA: Insufficient documentation

## 2014-09-13 DIAGNOSIS — Z8719 Personal history of other diseases of the digestive system: Secondary | ICD-10-CM | POA: Insufficient documentation

## 2014-09-13 DIAGNOSIS — E785 Hyperlipidemia, unspecified: Secondary | ICD-10-CM | POA: Insufficient documentation

## 2014-09-13 DIAGNOSIS — I2510 Atherosclerotic heart disease of native coronary artery without angina pectoris: Secondary | ICD-10-CM | POA: Insufficient documentation

## 2014-09-13 DIAGNOSIS — Z720 Tobacco use: Secondary | ICD-10-CM | POA: Insufficient documentation

## 2014-09-13 DIAGNOSIS — Z7982 Long term (current) use of aspirin: Secondary | ICD-10-CM | POA: Insufficient documentation

## 2014-09-13 DIAGNOSIS — Z8669 Personal history of other diseases of the nervous system and sense organs: Secondary | ICD-10-CM | POA: Insufficient documentation

## 2014-09-13 DIAGNOSIS — Y9389 Activity, other specified: Secondary | ICD-10-CM | POA: Insufficient documentation

## 2014-09-13 DIAGNOSIS — Z8659 Personal history of other mental and behavioral disorders: Secondary | ICD-10-CM | POA: Insufficient documentation

## 2014-09-13 DIAGNOSIS — S60561A Insect bite (nonvenomous) of right hand, initial encounter: Secondary | ICD-10-CM | POA: Insufficient documentation

## 2014-09-13 DIAGNOSIS — Y998 Other external cause status: Secondary | ICD-10-CM | POA: Insufficient documentation

## 2014-09-13 DIAGNOSIS — Y9289 Other specified places as the place of occurrence of the external cause: Secondary | ICD-10-CM | POA: Insufficient documentation

## 2014-09-13 DIAGNOSIS — R011 Cardiac murmur, unspecified: Secondary | ICD-10-CM | POA: Insufficient documentation

## 2014-09-13 HISTORY — DX: Aortic aneurysm of unspecified site, without rupture: I71.9

## 2014-09-13 MED ORDER — CEPHALEXIN 500 MG PO CAPS: 500.0000 mg | ORAL_CAPSULE | Freq: Four times a day (QID) | ORAL | Status: DC

## 2014-09-13 MED ORDER — TRAMADOL HCL 50 MG PO TABS: 50.0000 mg | ORAL_TABLET | Freq: Four times a day (QID) | ORAL | Status: AC | PRN

## 2014-09-13 NOTE — Discharge Instructions (Signed)
Take the prescribed medication as directed. Follow-up with your primary care physician for wound check in the next few days. Return to the ED for new or worsening symptoms.

## 2014-09-13 NOTE — ED Notes (Signed)
Pt reports painful, red bump btween 1st and 2nd digit on R hand since Friday. She thinks she may have been bitten by an insect.

## 2014-09-13 NOTE — ED Provider Notes (Signed)
CSN: [id number]     Arrival date & time 09/13/14  1846 History  This chart was scribed for non-physician practitioner, Garlon HatchetLisa M Katreena Schupp, PA-C, working with Toy CookeyMegan Docherty, MD, by Modena JanskyAlbert Thayil, ED Scribe. This patient was seen in room TR05C/TR05C and the patient's care was started at 7:00 PM   Chief Complaint  Patient presents with  . Wound Infection   The history is provided by the patient. No language interpreter was used.   HPI Comments: Holly Dunn is a 49 y.o. female who presents to the Emergency Department complaining of a wound infection that started yesterday. She reports that yesterday her right hand started itching. She states today redness has spread to the other side of her thumb and she became concerned.  She reports that she does not specifically recall any insect bite but thinks that is what happened.  No fever, chills, sweats.  No numbness/paresthesias of hand.  Patient did have heart catherization on 08/29/14 via right wrist-- states that has healed well at this time. VSS.  Past Medical History  Diagnosis Date  . Benign essential HTN 01/24/2012  . Tobacco abuse 01/26/2012    2ppd from 1998-2008. Stopped 2008-2011. Restarted after death of husband. As of 01/24/12, smoking 1ppd. Approximately 25 pack year history. Would like to quit.   . Coronary artery disease, non-occlusive 01/26/2012    Cardiac cath in 2009 performed [place]. Virgel ManifoldJoseph Mobley [place]. 40% stenosis of LAD proximal to 2nd diagonal. EF 55%  . Hyperlipidemia LDL goal < 100 02/17/2012  . Amaurosis fugax of right eye 2011    "~ 15""  . Heart murmur   . Sleep apnea     "left mask in [place] in 2012; haven't got new one" (08/27/2013)  . GERD (gastroesophageal reflux disease)   . Migraine     "probably 3 times/month" (08/27/2014)  . Arthritis     "knees, right ankle, neck" (08/27/2014)  . Depression     "lately" (08/27/2014)  . Aortic aneurysm    Past Surgical History  Procedure Laterality Date  . Tonsillectomy and  adenoidectomy  1970  . Nm myoview ltd  2011    normal perfusion, no iscemia, EF 54%  . Cesarean section  1998; 2000  . Cholecystectomy    . Fracture surgery    . Closed reduction hand fracture Right 1979  . Tubal ligation  2000  . Cardiac catheterization  2009    40% LAD stenosis at one junction; 40% stenosis if high obtuse marginal off l. circ  . Left heart catheterization with coronary angiogram N/A 08/29/2014    Procedure: LEFT HEART CATHETERIZATION WITH CORONARY ANGIOGRAM;  Surgeon: Dolores Pattyaniel R Bensimhon, MD;  [facility];  Service: Cardiovascular;  Laterality: N/A;   Family History  Problem Relation Age of Onset  . Heart disease Mother 2945    First MI  . Hypertension Mother   . Kidney disease Mother   . Hyperlipidemia Mother   . Diabetes Mother   . Diabetes Father   . Hypertension Father   . Heart disease Maternal Grandmother   . Heart disease Maternal Grandfather   . Heart disease Paternal Grandmother   . Heart disease Paternal Grandfather    History  Substance Use Topics  . Smoking status: Current Every Day Smoker -- 0.50 packs/day for 25 years    Types: Cigarettes  . Smokeless tobacco: Never Used  . Alcohol Use: Yes     Comment: "I used to drink; haven't drank since  2005"   OB History    Gravida Para Term Preterm AB TAB SAB Ectopic Multiple Living   Review of Systems  Skin: Positive for wound.  All other systems reviewed and are negative.   Allergies  Review of patient's allergies indicates no known allergies.  Home Medications   Prior to Admission medications   Medication Sig Start Date End Date Taking? Authorizing Provider  albuterol (PROVENTIL HFA;VENTOLIN HFA) 108 (90 BASE) MCG/ACT inhaler Inhale 1-2 puffs into the lungs every 6 (six) hours as needed for wheezing or shortness of breath. 05/08/14   Glynn Octave, MD  aspirin EC 81 MG tablet Take 81 mg by mouth daily.    Historical Provider, MD  carvedilol (COREG) 3.125 MG  tablet Take 1 tablet (3.125 mg total) by mouth 2 (two) times daily with a meal. 09/03/14   Elenora Gamma, MD  clopidogrel (PLAVIX) 75 MG tablet Take 1 tablet (75 mg total) by mouth daily with breakfast. 08/30/14   Ok Anis, NP  Fish Oil-Cholecalciferol (FISH OIL/D3 ADULT GUMMIES) 184-250 MG-UNIT CHEW Chew 2 capsules by mouth daily.    Historical Provider, MD  furosemide (LASIX) 40 MG tablet Take 1 tablet (40 mg total) by mouth daily. 09/03/14   Elenora Gamma, MD  lisinopril (PRINIVIL,ZESTRIL) 20 MG tablet Take 1 tablet (20 mg total) by mouth daily. 07/11/14   Kathee Delton, MD  Multiple Vitamin (MULTIVITAMIN) tablet Take 1 tablet by mouth daily.    Historical Provider, MD  nitroGLYCERIN (NITROSTAT) 0.4 MG SL tablet Place 1 tablet (0.4 mg total) under the tongue every 5 (five) minutes as needed for chest pain. 08/30/14   Ok Anis, NP  rosuvastatin (CRESTOR) 40 MG tablet Take 40 mg by mouth daily.    Historical Provider, MD   BP 109/71 mmHg  Pulse 91  Temp(Src) 98.5 F (36.9 C) (Oral)  Resp 20  Ht  (1.676 m)  Wt 250 lb (113.399 kg)  BMI 40.37 kg/m2  SpO2 100%  LMP 07/02/2013 Physical Exam  Constitutional: She is oriented to person, place, and time. She appears well-developed and well-nourished. No distress.  HENT:  Head: Normocephalic and atraumatic.  Mouth/Throat: Oropharynx is clear and moist.  Eyes: Conjunctivae and EOM are normal. Pupils are equal, round, and reactive to light.  Neck: Normal range of motion. Neck supple.  Cardiovascular: Normal rate, regular rhythm and normal heart sounds.   Pulmonary/Chest: Effort normal and breath sounds normal. No respiratory distress. She has no wheezes.  Musculoskeletal: Normal range of motion.       Right hand: She exhibits swelling.  Small pustula noted in interphalangeal space between right thumb and index finger; localized erythema extending to dorsal thumb, mild swelling, and warmth to touch; no signs of abscess  formation of cellulitis; full ROM of all fingers maintained; strong radial pulse and cap refill; normal sensation throughout hand Heart cath site of right wrist well healed without noted signs of infection  Neurological: She is alert and oriented to person, place, and time.  Skin: Skin is warm and dry. She is not diaphoretic.  Psychiatric: She has a normal mood and affect.  Nursing note and vitals reviewed.   ED Course  Procedures (including critical care time) DIAGNOSTIC STUDIES: Oxygen Saturation is 100% on RA, normal by my interpretation.    COORDINATION OF CARE: 7:04 PM- Pt advised of plan for treatment which includes medication and pt  agrees.  Labs Review Labs Reviewed - No data to display  Imaging Review No results found.   EKG Interpretation None      MDM   Final diagnoses:  Insect bite of right hand with infection, initial encounter   49 year old female with supposed insect bite between her right thumb and index finger. There is a small pustule noted with localized erythema extending to dorsum of thumb and warmth to touch with potential early infection. There are no signs of superimposed infection or cellulitis. Her heart catheterization site of her right wrist appears well-healed at this time. Do not believe current infection is arising from catheterization site. Patient will be started on Keflex and tramadol. She is to follow with her primary care physician for wound recheck.  Discussed plan with patient, he/she acknowledged understanding and agreed with plan of care.  Return precautions given for new or worsening symptoms.  I personally performed the services described in this documentation, which was scribed in my presence. The recorded information has been reviewed and is accurate.  Garlon Hatchet, PA-C 09/13/14 1911  Toy Cookey, MD 09/13/14 2128

## 2014-09-15 ENCOUNTER — Telehealth: Payer: Self-pay | Admitting: *Deleted

## 2014-09-15 NOTE — Telephone Encounter (Signed)
Signed order for Phase II cardiac rehab faxed.

## 2014-09-16 ENCOUNTER — Telehealth (HOSPITAL_COMMUNITY): Payer: Self-pay | Admitting: Cardiac Rehabilitation

## 2014-09-16 NOTE — Telephone Encounter (Signed)
-----   Message from Thurmon FairMihai Croitoru, MD sent at 09/16/2014  2:28 PM EST ----- Regarding: RE: cardiac rehab No restrictions (avoid lifting weights, which I don't think was planned anyway) Resting BP less than 150. Exercise BP 180 please. ----- Message -----    From: Robyne PeersJoann H Caden Fukushima, RN    Sent: 09/16/2014  10:23 AM      To: Thurmon FairMihai Croitoru, MD Subject: cardiac rehab                                  Dear Dr. Royann Shiversroitoru,  Pt is scheduled to begin cardiac rehab.  She has 4cm ascending thoracic aorta.  Are there any activity restrictions?  What are BP parameters at rest and with exercise?   Thank you, Deveron FurlongJoann Juanita Devincent, RN, BSN Cardiac Pulmonary Rehab

## 2014-09-18 ENCOUNTER — Encounter (HOSPITAL_COMMUNITY)
Admission: RE | Admit: 2014-09-18 | Discharge: 2014-09-18 | Disposition: A | Discharge status: Home / Self Care | Payer: Self-pay | Source: Ambulatory Visit | Attending: Cardiovascular Disease | Admitting: Cardiovascular Disease

## 2014-09-18 NOTE — Progress Notes (Signed)
Cardiac Rehab Medication Review by a Pharmacist  Does the patient  feel that his/her medications are working for him/her?  yes  Has the patient been experiencing any side effects to the medications prescribed?  no  Does the patient measure his/her own blood pressure or blood glucose at home?  no   Does the patient have any problems obtaining medications due to transportation or finances?   no  Understanding of regimen: good Understanding of indications: good Potential of compliance: excellent    Pharmacist comments: 49 yo F presents to cardiac rehab orientation in good spirits. Patient explains she just met someone that has been allowing her to stay at their house until she can get on her feet and is very grateful. Patient brought her medications with her and we reviewed what and why she was taking each medication. All questions were answered. She does not take her BP at home d/t not having a at home monitor. She explains that she does not have trouble obtaining medication. She believes her medications are working and is not experiencing and side effects at this time.  Thank you for allowing pharmacy to be part of this patient's care team  Milik Gilreath M. Heidi Lemay, Pharm.D Clinical Pharmacy Resident Pager: [phone] 09/18/2014 .8:25 AM

## 2014-09-19 ENCOUNTER — Ambulatory Visit: Payer: Self-pay | Admitting: Family Medicine

## 2014-09-22 ENCOUNTER — Encounter (HOSPITAL_COMMUNITY): Payer: Self-pay

## 2014-09-24 ENCOUNTER — Encounter (HOSPITAL_COMMUNITY): Payer: Self-pay

## 2014-09-25 ENCOUNTER — Telehealth (HOSPITAL_COMMUNITY): Payer: Self-pay | Admitting: *Deleted

## 2014-09-25 NOTE — Telephone Encounter (Signed)
Pt absent on yesterday on her first day of exercise.  Pt with sore throat and her voice sounded strained and hoarse.  Pt with continued cough.  Advised pt to hold on the start of cardiac rehab until next Wednesday.  Pt verbalized understanding and is in agreement of this. Alanson Alyarlette Carlton RN, BSN

## 2014-09-26 ENCOUNTER — Encounter (HOSPITAL_COMMUNITY): Payer: Self-pay

## 2014-09-29 ENCOUNTER — Encounter (HOSPITAL_COMMUNITY): Payer: Self-pay

## 2014-10-01 ENCOUNTER — Encounter (HOSPITAL_COMMUNITY)
Admission: RE | Admit: 2014-10-01 | Discharge: 2014-10-01 | Disposition: A | Discharge status: Home / Self Care | Payer: Self-pay | Source: Ambulatory Visit | Attending: Cardiovascular Disease | Admitting: Cardiovascular Disease

## 2014-10-01 DIAGNOSIS — Z48812 Encounter for surgical aftercare following surgery on the circulatory system: Secondary | ICD-10-CM | POA: Insufficient documentation

## 2014-10-01 DIAGNOSIS — Z955 Presence of coronary angioplasty implant and graft: Secondary | ICD-10-CM | POA: Insufficient documentation

## 2014-10-01 NOTE — Progress Notes (Signed)
Pt in today for first day of exercise in phase II cardiac rehab.  Pt delayed starting CR due to upper respiratory illness. Pt tolerated exercise with no complaints but did remark that the bike was hard for her due to the seat. Monitor showed sr with no ectopy noted. Medication list reconciled. Pt verbalizes compliance to medication and denes any barriers to obtaining medications.  Pt is however fearful of what may happen next month when she no longer has medical insurance.   Questioned further regarding her statement.  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  Pt has insurance coverage until the end of the month.  Pt prescriptions are all up to day with the refills.  Pt is meeting someone at [place] to talk to them about getting Medicaid.  Talked with pt about her current coverage and that she has a large out of pocket expense to be met before [employer] will pay.  Pt oop is 2,500.  Asked pt if she wanted to continue today's session.  Pt stated that she would. Pt informed that Medicaid will pay for cardiac rehab for those pts that qualify.  This will be determined by her cardiologist. Pt will return on Friday and hopefully will know how long of a process this will be.  Pt was iving at a shelter but is now staying with someone in their basement.  Pt is hopefully this will continue and they are aware of her termination.  Goals pt would like to achieve are for short term to build up strength in legs and back; increase energy.  Pt is exercising on her own presently.  Will review home exercise in greater detail with pt.  Long term goal is to lose weight.  Advised pt to attend nutritional classes that are offered on tuesdays.  Presently transportation is not an issue for pt.  Psychological Assessment PHQ2 score 1.  Pt denies any depression but admits her life is challenging with no permanent address or job.  Pt has significant support from her faith in God.  She feels that things could  always be worse and she is fortunate to have "angels' interceding on her behalf.  Will follow up more with pt on Friday. Cherre Huger, BSN

## 2014-10-03 ENCOUNTER — Encounter (HOSPITAL_COMMUNITY): Admission: RE | Admit: 2014-10-03 | Payer: Self-pay | Source: Ambulatory Visit

## 2014-10-03 ENCOUNTER — Telehealth (HOSPITAL_COMMUNITY): Payer: Self-pay | Admitting: *Deleted

## 2014-10-03 NOTE — Telephone Encounter (Signed)
Pt absent from rehab today.  Expected pt to return today to update rehab staff with her intention to continue rehab with the termination of her job as well as insurance benefits.  Message left for pt to please contact cardiac rehab staff.  Phone number provided. Alanson Alyarlette Khaila Velarde RN, BSN

## 2014-10-06 ENCOUNTER — Encounter (HOSPITAL_COMMUNITY): Payer: Self-pay

## 2014-10-08 ENCOUNTER — Encounter (HOSPITAL_COMMUNITY): Payer: Self-pay

## 2014-10-10 ENCOUNTER — Encounter (HOSPITAL_COMMUNITY): Payer: Self-pay

## 2014-10-13 ENCOUNTER — Encounter (HOSPITAL_COMMUNITY): Payer: Self-pay

## 2014-10-15 ENCOUNTER — Encounter (HOSPITAL_COMMUNITY): Payer: Self-pay

## 2014-10-16 ENCOUNTER — Telehealth (HOSPITAL_COMMUNITY): Payer: Self-pay | Admitting: *Deleted

## 2014-10-16 ENCOUNTER — Encounter: Payer: Self-pay | Admitting: Cardiovascular Disease

## 2014-10-16 ENCOUNTER — Ambulatory Visit (INDEPENDENT_AMBULATORY_CARE_PROVIDER_SITE_OTHER): Payer: Self-pay | Admitting: Cardiovascular Disease

## 2014-10-16 VITALS — BP 140/68 | HR 84 | Ht 66.0 in | Wt 257.5 lb

## 2014-10-16 DIAGNOSIS — I10 Essential (primary) hypertension: Secondary | ICD-10-CM

## 2014-10-16 DIAGNOSIS — I7121 Aneurysm of the ascending aorta, without rupture: Secondary | ICD-10-CM

## 2014-10-16 DIAGNOSIS — I712 Thoracic aortic aneurysm, without rupture: Secondary | ICD-10-CM

## 2014-10-16 NOTE — Telephone Encounter (Signed)
Placed second call to pt regarding ability to attend Cardiac Rehab.  Pt recently lost her job at [employer] and no longer has insurance coverage at the end of the month.  Pt stated that she tried to get medicaid but was told she was not qualified because she was not disabled.  Pt was told to apply for an orange card. Asked pt about shelter. Pt is currently residing with a friend and is staying in her basement. Pt has food from food stamps.  Pt ran out of her bp medications. Pt has appt today with cardiologist.  Pt to ask for any samples that may be available for her to use in the mean while. Decided that pt would not be able to return back to rehab at this time. Advised pt that if her insurance situation changed she would be able to return back and finish rehab.  Pt discharged from program with the completion of one exercise session.

## 2014-10-16 NOTE — Patient Instructions (Signed)
Dr. Royann Shiversroitoru recommends that you schedule a follow-up appointment in: 5 months.

## 2014-10-17 ENCOUNTER — Encounter (HOSPITAL_COMMUNITY): Payer: Self-pay

## 2014-10-18 ENCOUNTER — Encounter: Payer: Self-pay | Admitting: Cardiovascular Disease

## 2014-10-18 NOTE — Progress Notes (Signed)
Reason for office visit CAD s/p recent LAD DES  Holly Dunn is a 49 y.o. female history of hypertension, tobacco abuse, coronary artery disease, hyperlipidemia, and obesity. She was recently hospitalized and underwent coronary angiography and stenting of the mid LAD with a drug-eluting stent. She underwent CT angiogram of the chest which showed no pulmonary embolism and Dopplers negative for DVT. She has a 4 cm ascending thoracic aortic aneurysm. Echo showed EF 50-55%, no signs of increased filling pressure.  She was seen by Wilburt Finlay on Jan 19, complaining of dyspnea. Her chest x ray did not show CHF or other abnormalities.She "finished lasix last week" - I am not sure when this was prescribed. She cannot afford to buy medications and is not taking lisinopril. She is taking aspirin and clopidogrel. She has not smoked in 2 months. She is living in a friend's basement and gets food stamps. She has lost her job.     No Known Allergies  Current Outpatient Prescriptions  Medication Sig Dispense Refill  . albuterol (PROVENTIL HFA;VENTOLIN HFA) 108 (90 BASE) MCG/ACT inhaler Inhale 1-2 puffs into the lungs every 6 (six) hours as needed for wheezing or shortness of breath. 1 Inhaler 0  . aspirin EC 81 MG tablet Take 81 mg by mouth daily.    Marland Kitchen atorvastatin (LIPITOR) 80 MG tablet Take 80 mg by mouth daily.    . carvedilol (COREG) 3.125 MG tablet Take 1 tablet (3.125 mg total) by mouth 2 (two) times daily with a meal. 60 tablet 3  . clopidogrel (PLAVIX) 75 MG tablet Take 1 tablet (75 mg total) by mouth daily with breakfast. 30 tablet 6  . Fish Oil-Cholecalciferol (FISH OIL/D3 ADULT GUMMIES) 184-250 MG-UNIT CHEW Chew 1 capsule by mouth every other day.     . Multiple Vitamin (MULTIVITAMIN) tablet Take 1 tablet by mouth daily.    . nitroGLYCERIN (NITROSTAT) 0.4 MG SL tablet Place 1 tablet (0.4 mg total) under the tongue every 5 (five) minutes as needed for chest pain. 25 tablet 3  . Sodium  Bicarbonate-Citric Acid (ALKA-SELTZER HEARTBURN PO) Take 1 tablet by mouth as needed (for heartburn).    . traMADol (ULTRAM) 50 MG tablet Take 1 tablet (50 mg total) by mouth every 6 (six) hours as needed. 15 tablet 0  . lisinopril (PRINIVIL,ZESTRIL) 20 MG tablet Take 1 tablet (20 mg total) by mouth daily. (Patient not taking: Reported on 10/16/2014) 30 tablet 5   No current facility-administered medications for this visit.    Past Medical History  Diagnosis Date  . Benign essential HTN 2012-02-10  . Tobacco abuse 01/26/2012    2ppd from 1998-2008. Stopped 2008-2011. Restarted after death of husband. As of February 10, 2012, smoking 1ppd. Approximately 25 pack year history. Would like to quit.   . Coronary artery disease, non-occlusive 01/26/2012    Cardiac cath in 2009 performed by Dr. Virgel Manifold in [place], [place]. 40% stenosis of LAD proximal to 2nd diagonal. EF 55%  . Hyperlipidemia LDL goal < 100 02/17/2012  . Amaurosis fugax of right eye 2011    "~ 15""  . Heart murmur   . Sleep apnea     "left mask in [place] in 2012; haven't got new one" (08/27/2013)  . GERD (gastroesophageal reflux disease)   . Migraine     "probably 3 times/month" (08/27/2014)  . Arthritis     "knees, right ankle, neck" (08/27/2014)  . Depression     "lately" (08/27/2014)  . Aortic aneurysm   .  Coronary artery disease s/p LAD-DES January 2016 01/26/2012    Cardiac cath in 2009 performed by Dr. Virgel Manifold in [place], [place]. 40% stenosis of LAD proximal to 2nd diagonal. EF 55% Section cardiac cath in January 2016 with 70% occlusion of LAD, now status post DES     Past Surgical History  Procedure Laterality Date  . Tonsillectomy and adenoidectomy  1970  . Nm myoview ltd  2011    normal perfusion, no iscemia, EF 54%  . Cesarean section  1998; 2000  . Cholecystectomy    . Fracture surgery    . Closed reduction hand fracture Right 1979  . Tubal ligation  2000  . Cardiac catheterization  2009    40% LAD stenosis at one junction;  40% stenosis if high obtuse marginal off l. circ  . Left heart catheterization with coronary angiogram N/A 08/29/2014    Procedure: LEFT HEART CATHETERIZATION WITH CORONARY ANGIOGRAM;  Surgeon: Dolores Patty, MD;  Location: [facility] CATH LAB;  Service: Cardiovascular;  Laterality: N/A;    Family History  Problem Relation Age of Onset  . Heart disease Mother 40    First MI  . Hypertension Mother   . Kidney disease Mother   . Hyperlipidemia Mother   . Diabetes Mother   . Diabetes Father   . Hypertension Father   . Heart disease Maternal Grandmother   . Heart disease Maternal Grandfather   . Heart disease Paternal Grandmother   . Heart disease Paternal Grandfather     History   Social History  . Marital Status: Widowed    Spouse Name: N/A  . Number of Children: N/A  . Years of Education: N/A   Occupational History  . clerk     Works at [employer]   . student     going for healthcare mgt    Social History Main Topics  . Smoking status: Current Every Day Smoker -- 0.50 packs/day for 25 years    Types: Cigarettes  . Smokeless tobacco: Never Used  . Alcohol Use: Yes     Comment: "I used to drink; haven't drank since 2005"  . Drug Use: No  . Sexual Activity: No   Other Topics Concern  . Not on file   Social History Narrative    Review of systems: Exertional dyspnea, class II. Chest discomfort occurs with dyspnea, but feels quite different from her previous angina.  PHYSICAL EXAM BP 140/68 mmHg  Pulse 84  Ht  (1.676 m)  Wt 257 lb 8 oz (116.801 kg)  BMI 41.58 kg/m2  LMP 07/02/2013 Exam is somewhat limited by obesity General: Alert, oriented x3, no distress Head: no evidence of trauma, PERRL, EOMI, no exophtalmos or lid lag, no myxedema, no xanthelasma; normal ears, nose and oropharynx Neck: normal jugular venous pulsations and no hepatojugular reflux; brisk carotid pulses without delay and no carotid bruits Chest: clear to auscultation, no signs of  consolidation by percussion or palpation, normal fremitus, symmetrical and full respiratory excursions Cardiovascular: normal position and quality of the apical impulse, regular rhythm, normal first and second heart sounds, no murmurs, rubs or gallops Abdomen: no tenderness or distention, no masses by palpation, no abnormal pulsatility or arterial bruits, normal bowel sounds, no hepatosplenomegaly Extremities: no clubbing, cyanosis or edema; 2+ radial, ulnar and brachial pulses bilaterally; 2+ right femoral, posterior tibial and dorsalis pedis pulses; 2+ left femoral, posterior tibial and dorsalis pedis pulses; no subclavian or femoral bruits Neurological: grossly nonfocal   EKG: NSR -  normal ECG  Lipid Panel     Component Value Date/Time   CHOL 304* 07/11/2014 1445   TRIG 258* 07/11/2014 1445   HDL 38* 07/11/2014 1445   CHOLHDL 8.0 07/11/2014 1445   VLDL 52* 07/11/2014 1445   LDLCALC 214* 07/11/2014 1445    BMET    Component Value Date/Time   NA 132* 09/09/2014 1127   K 4.5 09/09/2014 1127   CL 97 09/09/2014 1127   CO2 22 09/09/2014 1127   GLUCOSE 94 09/09/2014 1127   BUN 13 09/09/2014 1127   CREATININE 0.87 09/09/2014 1127   CREATININE 1.01 08/30/2014 0307   CALCIUM 9.9 09/09/2014 1127   GFRNONAA 65* 08/30/2014 0307   GFRNONAA 80 07/11/2014 1445   GFRAA 75* 08/30/2014 0307   GFRAA >89 07/11/2014 1445     ASSESSMENT AND PLAN  Holly Dunn is going through social and financial difficulties that are impeding appropriate medical care. She will have some funds to buy medications this week, when she receives her first unemployment check.   There are no overt clinical findings of CHF. I don't think diuretics will help. Her dyspnea may be related to obesity and reactive airway disease and a "cold" that she is getting over.  Reviewed the critical importance of dual antiplatelet therapy for a full year. Need to repeat lipids on statin therapy, but it is still too early.  She will  call back if she still feels poorly after resuming all her meds. Consider changing carvedilol to metoprolol if she wheezes a lot, but I doubt the current tiny carvedilol dose is the culprit for her symptoms.  Patient Instructions  Dr. Royann Shiversroitoru recommends that you schedule a follow-up appointment in: 5 months.      Holly SilkROITORU,Holly Dunn  Holly Vossler, MD, [facility] [phone] office [phone] pager

## 2014-10-20 ENCOUNTER — Encounter (HOSPITAL_COMMUNITY): Payer: Self-pay

## 2014-10-22 ENCOUNTER — Encounter (HOSPITAL_COMMUNITY): Payer: Self-pay

## 2014-10-24 ENCOUNTER — Encounter (HOSPITAL_COMMUNITY): Payer: Self-pay

## 2014-10-27 ENCOUNTER — Encounter (HOSPITAL_COMMUNITY): Payer: Self-pay

## 2014-10-29 ENCOUNTER — Encounter (HOSPITAL_COMMUNITY): Payer: Self-pay

## 2014-10-31 ENCOUNTER — Encounter (HOSPITAL_COMMUNITY): Payer: Self-pay

## 2014-11-03 ENCOUNTER — Encounter (HOSPITAL_COMMUNITY): Payer: Self-pay

## 2014-11-05 ENCOUNTER — Encounter (HOSPITAL_COMMUNITY): Payer: Self-pay

## 2014-11-07 ENCOUNTER — Encounter (HOSPITAL_COMMUNITY): Payer: Self-pay

## 2014-11-10 ENCOUNTER — Encounter (HOSPITAL_COMMUNITY): Payer: Self-pay

## 2014-11-12 ENCOUNTER — Encounter (HOSPITAL_COMMUNITY): Payer: Self-pay

## 2014-11-14 ENCOUNTER — Encounter (HOSPITAL_COMMUNITY): Payer: Self-pay

## 2014-11-17 ENCOUNTER — Encounter (HOSPITAL_COMMUNITY): Payer: Self-pay

## 2014-11-19 ENCOUNTER — Encounter (HOSPITAL_COMMUNITY): Payer: Self-pay

## 2014-11-21 ENCOUNTER — Encounter (HOSPITAL_COMMUNITY): Payer: Self-pay

## 2014-11-24 ENCOUNTER — Encounter (HOSPITAL_COMMUNITY): Payer: Self-pay

## 2014-11-26 ENCOUNTER — Encounter (HOSPITAL_COMMUNITY): Payer: Self-pay

## 2014-11-28 ENCOUNTER — Encounter (HOSPITAL_COMMUNITY): Payer: Self-pay

## 2014-12-01 ENCOUNTER — Encounter (HOSPITAL_COMMUNITY): Payer: Self-pay

## 2014-12-03 ENCOUNTER — Encounter (HOSPITAL_COMMUNITY): Payer: Self-pay

## 2014-12-05 ENCOUNTER — Encounter (HOSPITAL_COMMUNITY): Payer: Self-pay

## 2014-12-08 ENCOUNTER — Encounter (HOSPITAL_COMMUNITY): Payer: Self-pay

## 2014-12-10 ENCOUNTER — Encounter (HOSPITAL_COMMUNITY): Payer: Self-pay

## 2014-12-12 ENCOUNTER — Encounter (HOSPITAL_COMMUNITY): Payer: Self-pay

## 2014-12-15 ENCOUNTER — Encounter (HOSPITAL_COMMUNITY): Payer: Self-pay

## 2014-12-17 ENCOUNTER — Encounter (HOSPITAL_COMMUNITY): Payer: Self-pay

## 2014-12-19 ENCOUNTER — Encounter (HOSPITAL_COMMUNITY): Payer: Self-pay

## 2014-12-22 ENCOUNTER — Encounter (HOSPITAL_COMMUNITY): Payer: Self-pay

## 2014-12-24 ENCOUNTER — Encounter (HOSPITAL_COMMUNITY): Payer: Self-pay

## 2014-12-26 ENCOUNTER — Encounter (HOSPITAL_COMMUNITY): Payer: Self-pay

## 2015-03-25 ENCOUNTER — Other Ambulatory Visit: Payer: Self-pay | Admitting: Nurse Practitioner

## 2015-03-25 NOTE — Telephone Encounter (Signed)
REFILL 

## 2015-03-26 IMAGING — DX DG CHEST 2V
2 series · 2 of 2 positions shown · non-contrast
Comparison: 05/08/2014

CLINICAL DATA: Cough for 3 weeks.  Mid chest pain.

EXAM:
CHEST  2 VIEW

[chest pa]
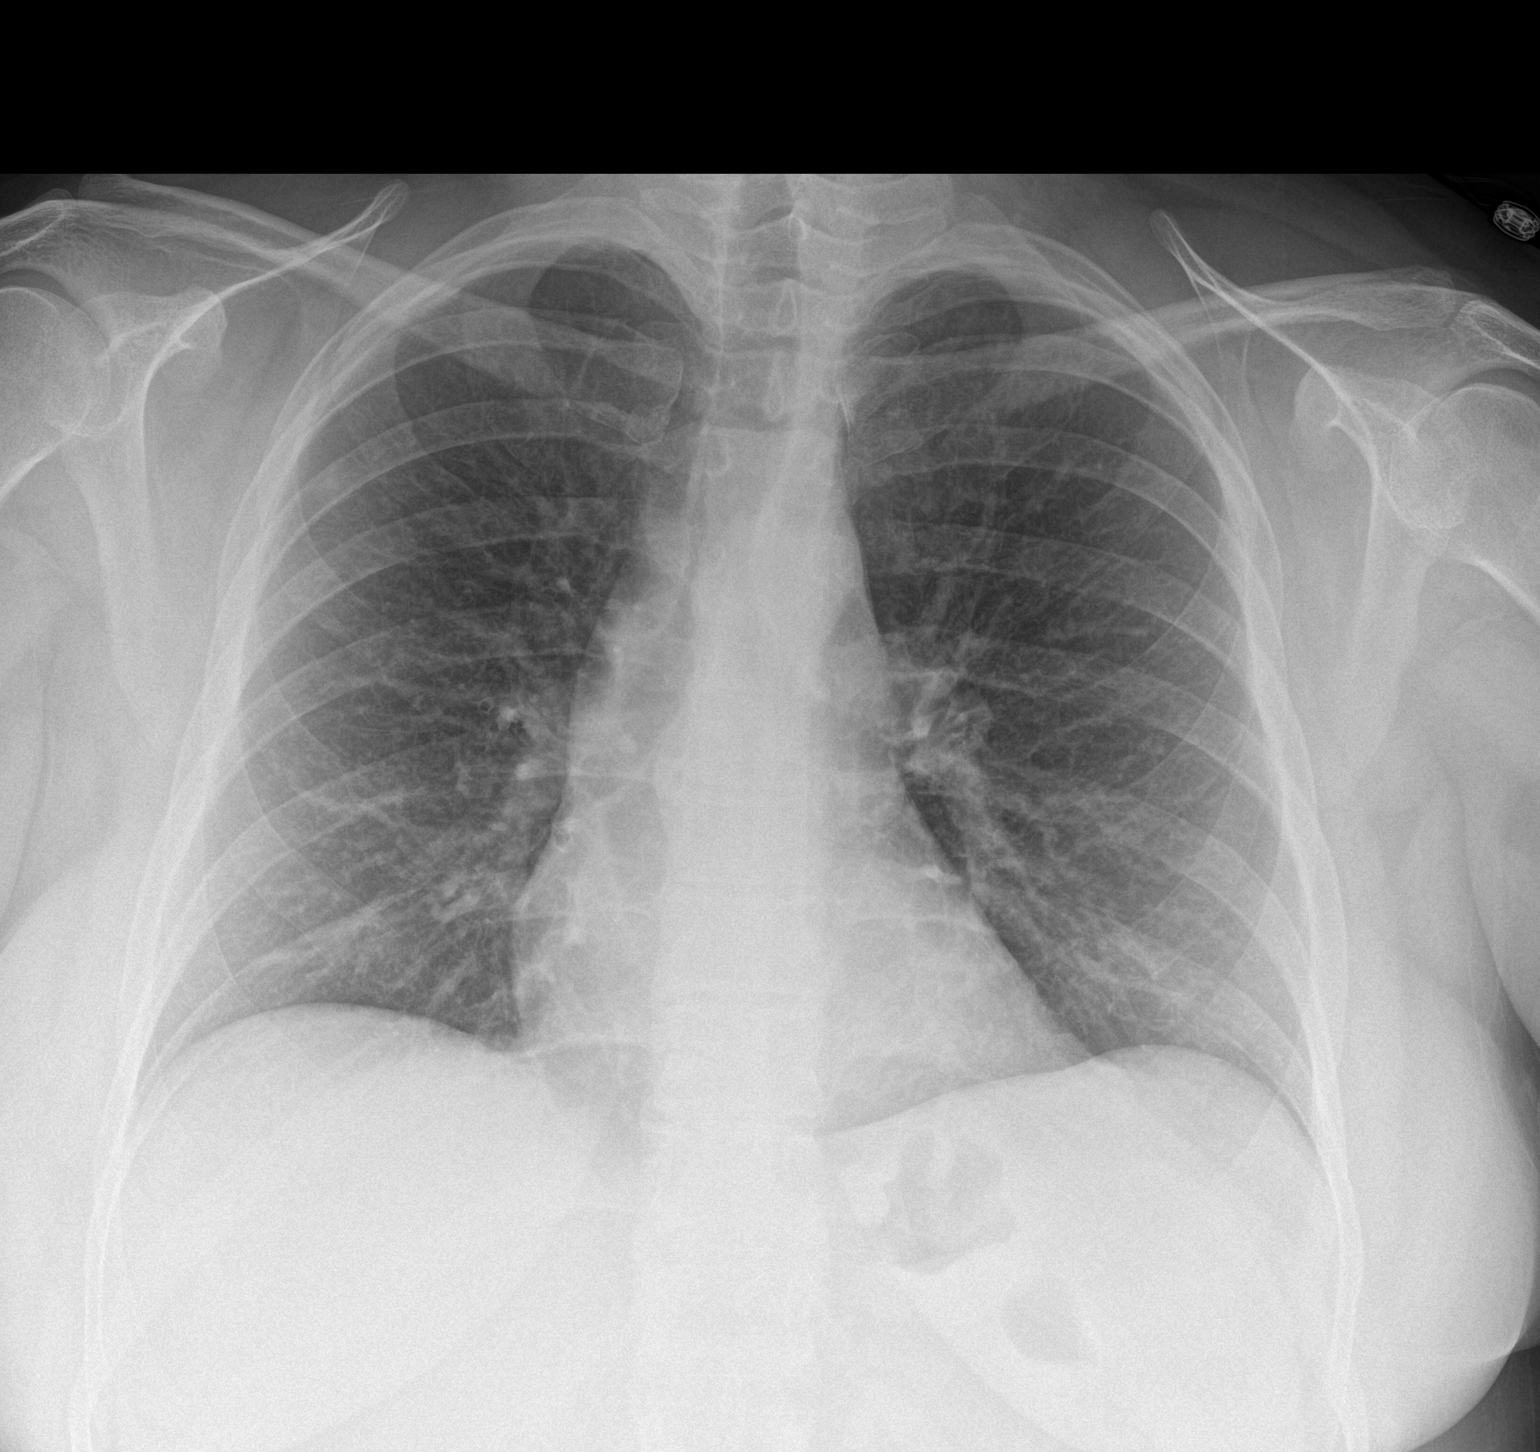

[chest lat]
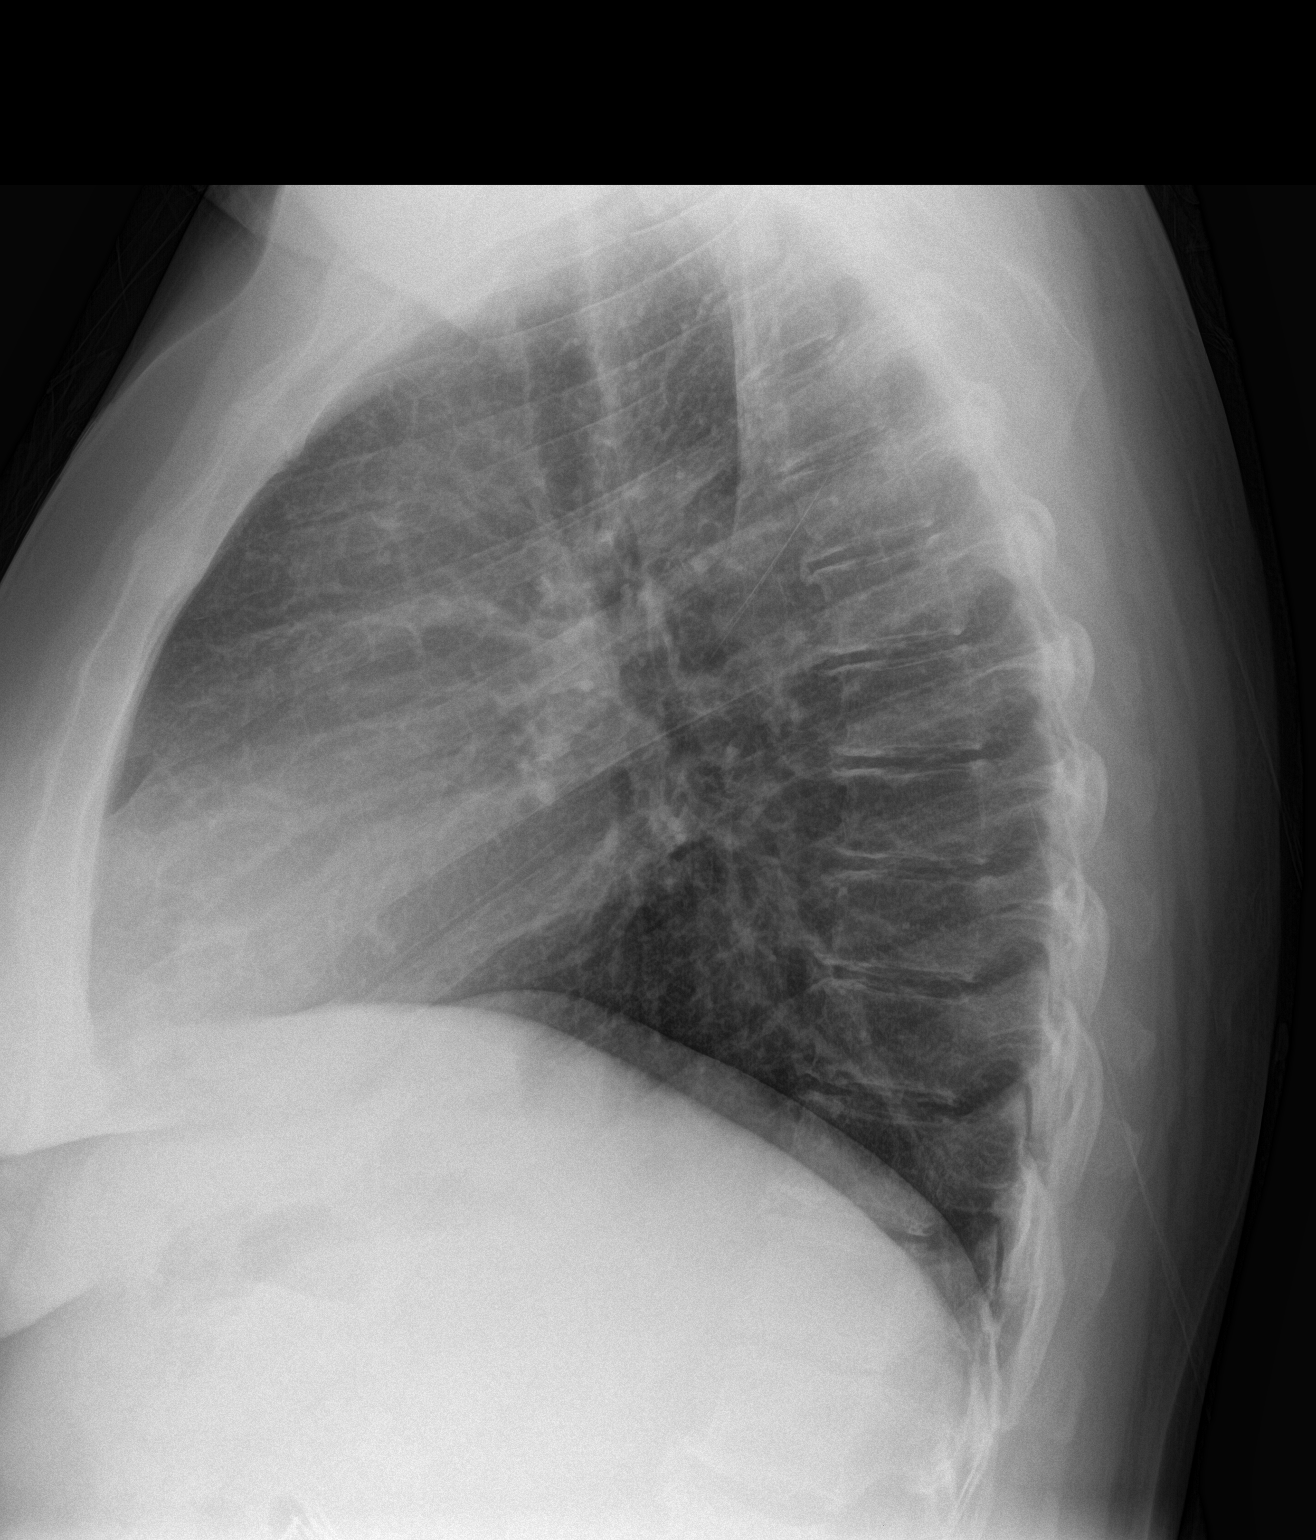

[2 of 2 positions shown; findings below may reference images not displayed]

FINDINGS: The heart size and mediastinal contours are within normal limits.
Both lungs are clear. The visualized skeletal structures are
unremarkable.
IMPRESSION: No active cardiopulmonary disease.

## 2015-04-24 ENCOUNTER — Other Ambulatory Visit: Payer: Self-pay | Admitting: Nurse Practitioner

## 2015-06-27 ENCOUNTER — Other Ambulatory Visit: Payer: Self-pay | Admitting: Cardiovascular Disease

## 2016-09-30 ENCOUNTER — Telehealth: Payer: Self-pay | Admitting: Family Medicine

## 2016-09-30 NOTE — Telephone Encounter (Signed)
Voicemail showed number belonging to different person. Number may have been changed or is now invalid.

## 2016-11-15 ENCOUNTER — Ambulatory Visit
Admit: 2016-11-15 | Discharge: 2016-11-15 | Payer: PRIVATE HEALTH INSURANCE | Attending: Surgery | Primary: Family Medicine

## 2016-11-15 DIAGNOSIS — K429 Umbilical hernia without obstruction or gangrene: Secondary | ICD-10-CM

## 2016-11-15 NOTE — Progress Notes (Signed)
Jaslene Marsteller A. Latica Hohmann, Jr., MD  [facility]  [address], [address]  [place]  [address]  [phone]      Note  11/15/2016    Lynett Littlefield  MRN: [id number]    Requesting Provider:      Primary Care Physician: None    Reason for Consult: umbilical hernia    HISTORY OF PRESENT ILLNESS:   Sandy Mahoney is a 51 y.o. year old female who presents with a periumbilical bulge.  It has started to bother her with pressure sensations and pain when she bumps it.  It has not been red or firm.  She denies any fevers or chills, no nausea or vomiting, no diarrhea or constipation.  She wishes that it be addressed.  The patient complains of discomfort that has lasted for months.  The severity is rated a 2/10 and has not improved with the use of conservative measures.  It eventually progressed to more daily symptoms  The patient does not describe a history of GERD and is not taking acid suppression therapy.  The patient desires evaluation of their umbilical hernai and for me to formulate a treatment plan.      REVIEW OF SYSTEMS:   Constitutional: No fevers/chills, no weakness, no fatigue, no lightheadedness, no night sweats, no insomnia, no appetite changes, no weight changes, no memory problems.           Respiratory: No cough, no dyspnea, no wheezing, no hemoptysis, no sleep apnea   Cardiovascular: No chest pains, no chest pressure, no chest tightness, no palpitations   Gastrointestinal: Umbilical hernia. Otherwise per HPI   Genitourinary: No dysuria, no hematuria, no urinary frequency, no nocturia, no recent UTIs   Musculoskeletal: No back/neck pain, no arthritis, no joint pain/swelling, no muscle pains   Skin: No rashes, no itching, no ulcers   Hematologic:  No easy bruising, no anemia             PAST MEDICAL HISTORY:     Past Medical History:   Diagnosis Date   ??? Aneurysm (HCC)    ??? Headache    ??? Hypercholesterolemia    ??? Hypertension    ??? Sleep apnea         PAST SURGICAL HISTORY:     Past Surgical History:   Procedure Laterality Date   ??? CARDIAC SURG PROCEDURE UNLIST      heart cath 2016, 2017   ??? HX CESAREAN SECTION  1998,2000   ??? HX CHOLECYSTECTOMY  1988   ??? HX ORTHOPAEDIC Bilateral     knee arthroscopy   ??? HX ORTHOPAEDIC Right     2 fingers -fractured       ALLERGIES: No Known Allergies    HOME MEDICATIONS:   Current Outpatient Prescriptions   Medication Sig   ??? atorvastatin (LIPITOR) 80 mg tablet Take 80 mg by mouth daily.   ??? clopidogrel (PLAVIX) 75 mg tab Take 75 mg by mouth daily.   ??? lisinopril (PRINIVIL, ZESTRIL) 20 mg tablet Take 20 mg by mouth daily.   ??? aspirin delayed-release 81 mg tablet Take 81 mg by mouth daily.   ??? carvedilol (COREG) 3.125 mg tablet Take 3.125 mg by mouth two (2) times a day.     No current facility-administered medications for this visit.        SOCIAL HISTORY:    Social History     Social History   ??? Marital status: SINGLE     Spouse name:   N/A   ??? Number of children: N/A   ??? Years of education: N/A     Occupational History   ??? Not on file.     Social History Main Topics   ??? Smoking status: Former Smoker     Quit date: 08/29/2014   ??? Smokeless tobacco: Never Used   ??? Alcohol use No   ??? Drug use: No   ??? Sexual activity: Not on file     Other Topics Concern   ??? Not on file     Social History Narrative   ??? No narrative on file       PREVENTION:     FAMILY HISTORY: see below,  colon or rectal cancer.  No history of polyps.  See below,   breast, prostate, ovary, endometrial, gastric, or pancreatic cancers.    Family History   Problem Relation Age of Onset   ??? Heart Disease Mother    ??? Other Mother      renal failure   ??? Diabetes Mother    ??? Hypertension Mother    ??? Diabetes Father    ??? Hypertension Father        PHYSICAL EXAM:  Blood pressure 124/62, pulse 78, height 5' 6.5" (1.689 m), weight 270 lb (122.5 kg).  Body mass index is 42.93 kg/(m^2).  Female patient's are evaluated in the presence of a medical assistant or  nurse or at the request of a female patient.  General: Normotensive, in no acute distress, well developed, well nourished appearing   Head:  AT/NC, no lesions   Eyes:  PERRLA, EOM's full, conjunctivae clear   Neck:  Supple, no masses, no lymphadenopathy, no thyromegaly, no carotid bruits   Chest:  Lungs clear, no rales, no rhonchi, no wheezes   Heart:  RR, no murmurs, no rubs, no gallops    Abdomen:  Soft, umbilical hernia, symptomatic with coughing, no strangulation, contains omentum   Rectal:  Deferred    Back:  Normal curvature, no tenderness.    Extremities:  FROM, no deformities, no edema, no erythema   Neuro:  Physiologic, oriented x3, full affect, no localizing findings   Skin:  Normal, no rashes, no lesions noted.            IMAGING: Reviewed tests.  none      ASSESSMENT:  The clinical history, physical exam and tests are consistent with diagnosis of a symptomatic umbilical hernia.  I have gone over the risks and benefits of conservative management vs. Surgical intervention.  After a lengthy discussion in which I gave the patient sufficient time to ask questions to their satisfaction they desire to proceed with surgery.  This is a 30 minute visit for the evaluation of the above diagnosis and greater than 50% of the visit is spent in face to face conversation with the patient to go over the risks and benefits of the chosen treatment and answer his questions to his satisfaction.        RECOMMENDATIONS:  Open umbilical hernia repair with mesh.  I explained the risks and benefits of the procedure in detail.  She agrees with the plan.    Please fax a copy to None    Tamaira Ciriello A. Jaeda Bruso, Jr, MD, FACS    11/15/2016

## 2016-11-15 NOTE — H&P (View-Only) (Signed)
Sandy Mahoney A. Konrad Dolores., MD  [facility]  [address], [address]  [place], [place]  [address]  [phone]      Note  11/15/2016    Sandy Mahoney  MRN: [id number]    Requesting Provider:      Primary Care Physician: None    Reason for Consult: umbilical hernia    HISTORY OF PRESENT ILLNESS:   Sandy Mahoney is a 51 y.o. year old female who presents with a periumbilical bulge.  It has started to bother her with pressure sensations and pain when she bumps it.  It has not been red or firm.  She denies any fevers or chills, no nausea or vomiting, no diarrhea or constipation.  She wishes that it be addressed.  The patient complains of discomfort that has lasted for months.  The severity is rated a 2/10 and has not improved with the use of conservative measures.  It eventually progressed to more daily symptoms  The patient does not describe a history of GERD and is not taking acid suppression therapy.  The patient desires evaluation of their umbilical hernai and for me to formulate a treatment plan.      REVIEW OF SYSTEMS:   Constitutional: No fevers/chills, no weakness, no fatigue, no lightheadedness, no night sweats, no insomnia, no appetite changes, no weight changes, no memory problems.           Respiratory: No cough, no dyspnea, no wheezing, no hemoptysis, no sleep apnea   Cardiovascular: No chest pains, no chest pressure, no chest tightness, no palpitations   Gastrointestinal: Umbilical hernia. Otherwise per HPI   Genitourinary: No dysuria, no hematuria, no urinary frequency, no nocturia, no recent UTIs   Musculoskeletal: No back/neck pain, no arthritis, no joint pain/swelling, no muscle pains   Skin: No rashes, no itching, no ulcers   Hematologic:  No easy bruising, no anemia             PAST MEDICAL HISTORY:     Past Medical History:   Diagnosis Date   ??? Aneurysm (HCC)    ??? Headache    ??? Hypercholesterolemia    ??? Hypertension    ??? Sleep apnea         PAST SURGICAL HISTORY:     Past Surgical History:   Procedure Laterality Date   ??? CARDIAC SURG PROCEDURE UNLIST      heart cath 2016, 2017   ??? HX CESAREAN SECTION  1998,2000   ??? HX CHOLECYSTECTOMY  1988   ??? HX ORTHOPAEDIC Bilateral     knee arthroscopy   ??? HX ORTHOPAEDIC Right     2 fingers -fractured       ALLERGIES: No Known Allergies    HOME MEDICATIONS:   Current Outpatient Prescriptions   Medication Sig   ??? atorvastatin (LIPITOR) 80 mg tablet Take 80 mg by mouth daily.   ??? clopidogrel (PLAVIX) 75 mg tab Take 75 mg by mouth daily.   ??? lisinopril (PRINIVIL, ZESTRIL) 20 mg tablet Take 20 mg by mouth daily.   ??? aspirin delayed-release 81 mg tablet Take 81 mg by mouth daily.   ??? carvedilol (COREG) 3.125 mg tablet Take 3.125 mg by mouth two (2) times a day.     No current facility-administered medications for this visit.        SOCIAL HISTORY:    Social History     Social History   ??? Marital status: SINGLE     Spouse name:  N/A   ??? Number of children: N/A   ??? Years of education: N/A     Occupational History   ??? Not on file.     Social History Main Topics   ??? Smoking status: Former Smoker     Quit date: 08/29/2014   ??? Smokeless tobacco: Never Used   ??? Alcohol use No   ??? Drug use: No   ??? Sexual activity: Not on file     Other Topics Concern   ??? Not on file     Social History Narrative   ??? No narrative on file       PREVENTION:     FAMILY HISTORY: see below,  colon or rectal cancer.  No history of polyps.  See below,   breast, prostate, ovary, endometrial, gastric, or pancreatic cancers.    Family History   Problem Relation Age of Onset   ??? Heart Disease Mother    ??? Other Mother      renal failure   ??? Diabetes Mother    ??? Hypertension Mother    ??? Diabetes Father    ??? Hypertension Father        PHYSICAL EXAM:  Blood pressure 124/62, pulse 78, height 5' 6.5" (1.689 m), weight 270 lb (122.5 kg).  Body mass index is 42.93 kg/(m^2).  Female patient's are evaluated in the presence of a medical assistant or  nurse or at the request of a female patient.  General: Normotensive, in no acute distress, well developed, well nourished appearing   Head:  AT/NC, no lesions   Eyes:  PERRLA, EOM's full, conjunctivae clear   Neck:  Supple, no masses, no lymphadenopathy, no thyromegaly, no carotid bruits   Chest:  Lungs clear, no rales, no rhonchi, no wheezes   Heart:  RR, no murmurs, no rubs, no gallops    Abdomen:  Soft, umbilical hernia, symptomatic with coughing, no strangulation, contains omentum   Rectal:  Deferred    Back:  Normal curvature, no tenderness.    Extremities:  FROM, no deformities, no edema, no erythema   Neuro:  Physiologic, oriented x3, full affect, no localizing findings   Skin:  Normal, no rashes, no lesions noted.            IMAGING: Reviewed tests.  none      ASSESSMENT:  The clinical history, physical exam and tests are consistent with diagnosis of a symptomatic umbilical hernia.  I have gone over the risks and benefits of conservative management vs. Surgical intervention.  After a lengthy discussion in which I gave the patient sufficient time to ask questions to their satisfaction they desire to proceed with surgery.  This is a 30 minute visit for the evaluation of the above diagnosis and greater than 50% of the visit is spent in face to face conversation with the patient to go over the risks and benefits of the chosen treatment and answer his questions to his satisfaction.        RECOMMENDATIONS:  Open umbilical hernia repair with mesh.  I explained the risks and benefits of the procedure in detail.  She agrees with the plan.    Please fax a copy to None    Stedman [place] A. Konrad DoloresSelwyn, Jr, MD, FACS    11/15/2016

## 2016-11-16 NOTE — Telephone Encounter (Signed)
I called patient to let her know she needs an appt with cardiology before they will clear her for surgery. She is going to call them today and let me know when she is scheduled.

## 2016-11-21 ENCOUNTER — Encounter

## 2016-11-22 NOTE — Addendum Note (Signed)
Addended by: Alleen Borne A on: 11/22/2016 12:06 PM      Modules accepted: Orders

## 2016-11-24 NOTE — Other (Addendum)
Patient verified name, DOB, and surgery as listed in [facility].     Type II surgery, phone assessment complete.  Orders received.    Labs per surgeon: none  Labs per anesthesia protocol: hgb  Instructed pt that anesthesia has ordered labs prior to DOS. Informed pt that out patient labs are performed at the [facility] entrance B.    Instructed [facility] lab is open Monday thru Friday 8:00am-4:30pm.     Patient answered medical/surgical history questions at their best of ability. All prior to admission medications documented in [facility].    Patient instructed to take the following medications the day of surgery according to anesthesia guidelines with a small sip of water:aspirin,coreg  .  Hold all vitamins 7 days prior to surgery and NSAIDS 5 days prior to surgery. Medications to be held plavix    Patient instructed on the following:  Arrive at A Entrance, time of arrival to be called the day before by 1700  NPO after midnight including gum, mints, and ice chips  Responsible adult must drive patient to the hospital, stay during surgery, and patient will  need supervision 24 hours after anesthesia  Use dial in shower the night before surgery and on the morning of surgery  Leave all valuables (money and jewelry) at home but bring insurance card and ID on DOS  Do not wear make-up, nail polish, lotions, cologne, perfumes, powders, or oil on skin.    Patient teach back successful and patient demonstrates knowledge of instruction.

## 2016-11-25 ENCOUNTER — Inpatient Hospital Stay: Admit: 2016-11-25 | Payer: PRIVATE HEALTH INSURANCE | Primary: Family Medicine

## 2016-11-25 ENCOUNTER — Inpatient Hospital Stay: Primary: Family Medicine

## 2016-11-25 DIAGNOSIS — Z01812 Encounter for preprocedural laboratory examination: Secondary | ICD-10-CM

## 2016-11-25 LAB — HEMOGLOBIN: HGB: 12.8 g/dL (ref 11.7–15.4)

## 2016-11-25 NOTE — Other (Signed)
HGB done today WNL    Recent Results (from the past 12 hour(s))   HEMOGLOBIN    Collection Time: 11/25/16  8:48 AM   Result Value Ref Range    HGB 12.8 11.7 - 15.4 g/dL

## 2016-11-25 NOTE — Telephone Encounter (Signed)
I called patient to let her know I received a fax from her cardiologist and she needs to stop plavix 5 days prior to her procedure on 12/01/16 and to continue her aspirin. She stated she had already spoken to that office and that they had gave her those instructions.

## 2016-11-25 NOTE — Other (Signed)
Most recent office note, EKG, stress, cath, echo requested from [facility] [phone]).

## 2016-11-29 NOTE — Other (Addendum)
Received faxed office note (11/24/16), EKG (11/24/16), echo (02/2010) and heart cath report (2/17) from [facility].     Also received cardiac clearance letter from Dr. Allena Katz: "pt is an acceptable risk for surgery".  Also, "reasonable to hold plavix for 5-7 days before surgery".     All documentation placed on chart for reference.

## 2016-11-29 NOTE — Other (Signed)
No response to request for medical records from [facility]      Called again, spoke with medical records clerk. Informed pt scheduled for surgery and anesthesia requesting most recent office note, EKG and any other cardiac test results to be faxed to [facility].

## 2016-12-01 ENCOUNTER — Inpatient Hospital Stay: Payer: PRIVATE HEALTH INSURANCE

## 2016-12-01 LAB — HCG URINE, QL. - POC: Pregnancy test,urine (POC): NEGATIVE

## 2016-12-01 MED ORDER — BUPIVACAINE (PF) 0.25 % (2.5 MG/ML) IJ SOLN
0.25 % (2.5 mg/mL) | INTRAMUSCULAR | Status: DC | PRN
Start: 2016-12-01 — End: 2016-12-01
  Administered 2016-12-01: 16:00:00 via SUBCUTANEOUS

## 2016-12-01 MED ORDER — MIDAZOLAM 1 MG/ML IJ SOLN
1 mg/mL | Freq: Once | INTRAMUSCULAR | Status: DC | PRN
Start: 2016-12-01 — End: 2016-12-01
  Administered 2016-12-01: 15:00:00 via INTRAVENOUS

## 2016-12-01 MED ORDER — ONDANSETRON (PF) 4 MG/2 ML INJECTION
4 mg/2 mL | INTRAMUSCULAR | Status: DC | PRN
Start: 2016-12-01 — End: 2016-12-01
  Administered 2016-12-01: 17:00:00 via INTRAVENOUS

## 2016-12-01 MED ORDER — CEFAZOLIN 2 GRAM/20 ML IN STERILE WATER INTRAVENOUS SYRINGE
2 gram/0 mL | Freq: Once | INTRAVENOUS | Status: AC
Start: 2016-12-01 — End: 2016-12-01
  Administered 2016-12-01: 16:00:00 via INTRAVENOUS

## 2016-12-01 MED ORDER — LIDOCAINE (PF) 20 MG/ML (2 %) IJ SOLN
20 mg/mL (2 %) | INTRAMUSCULAR | Status: DC | PRN
Start: 2016-12-01 — End: 2016-12-01
  Administered 2016-12-01: 16:00:00 via INTRAVENOUS

## 2016-12-01 MED ORDER — LACTATED RINGERS IV
INTRAVENOUS | Status: DC
Start: 2016-12-01 — End: 2016-12-01
  Administered 2016-12-01: 15:00:00 via INTRAVENOUS

## 2016-12-01 MED ORDER — HYDROMORPHONE (PF) 2 MG/ML IJ SOLN
2 mg/mL | INTRAMUSCULAR | Status: DC | PRN
Start: 2016-12-01 — End: 2016-12-01
  Administered 2016-12-01: 17:00:00 via INTRAVENOUS

## 2016-12-01 MED ORDER — ACETAMINOPHEN 1,000 MG/100 ML (10 MG/ML) IV
1000 mg/100 mL (10 mg/mL) | INTRAVENOUS | Status: DC | PRN
Start: 2016-12-01 — End: 2016-12-01
  Administered 2016-12-01: 17:00:00 via INTRAVENOUS

## 2016-12-01 MED ORDER — HYDROCODONE-ACETAMINOPHEN 5 MG-325 MG TAB
5-325 mg | ORAL | Status: DC | PRN
Start: 2016-12-01 — End: 2016-12-01

## 2016-12-01 MED ORDER — FAMOTIDINE 20 MG TAB
20 mg | Freq: Once | ORAL | Status: AC
Start: 2016-12-01 — End: 2016-12-01
  Administered 2016-12-01: 15:00:00 via ORAL

## 2016-12-01 MED ORDER — FENTANYL CITRATE (PF) 50 MCG/ML IJ SOLN
50 mcg/mL | INTRAMUSCULAR | Status: DC | PRN
Start: 2016-12-01 — End: 2016-12-01
  Administered 2016-12-01 (×2): via INTRAVENOUS

## 2016-12-01 MED ORDER — ROCURONIUM 10 MG/ML IV
10 mg/mL | INTRAVENOUS | Status: DC | PRN
Start: 2016-12-01 — End: 2016-12-01
  Administered 2016-12-01: 16:00:00 via INTRAVENOUS

## 2016-12-01 MED ORDER — LIDOCAINE HCL 1 % (10 MG/ML) IJ SOLN
10 mg/mL (1 %) | INTRAMUSCULAR | Status: DC | PRN
Start: 2016-12-01 — End: 2016-12-01
  Administered 2016-12-01: 15:00:00 via SUBCUTANEOUS

## 2016-12-01 MED ORDER — OXYCODONE-ACETAMINOPHEN 5 MG-325 MG TAB
5-325 mg | ORAL_TABLET | ORAL | 0 refills | Status: DC | PRN
Start: 2016-12-01 — End: 2017-06-20

## 2016-12-01 MED ORDER — PROPOFOL 10 MG/ML IV EMUL
10 mg/mL | INTRAVENOUS | Status: DC | PRN
Start: 2016-12-01 — End: 2016-12-01
  Administered 2016-12-01: 16:00:00 via INTRAVENOUS

## 2016-12-01 MED ORDER — HEPARIN (PORCINE) 5,000 UNIT/ML IJ SOLN
5000 unit/mL | Freq: Once | INTRAMUSCULAR | Status: AC
Start: 2016-12-01 — End: 2016-12-01
  Administered 2016-12-01: 15:00:00 via SUBCUTANEOUS

## 2016-12-01 MED ORDER — BUPIVACAINE (PF) 0.25 % (2.5 MG/ML) IJ SOLN
0.25 % (2.5 mg/mL) | INTRAMUSCULAR | Status: AC
Start: 2016-12-01 — End: ?

## 2016-12-01 MED ORDER — FENTANYL CITRATE (PF) 50 MCG/ML IJ SOLN
50 mcg/mL | INTRAMUSCULAR | Status: AC
Start: 2016-12-01 — End: ?

## 2016-12-01 MED ORDER — SUGAMMADEX 100 MG/ML INTRAVENOUS SOLUTION
100 mg/mL | INTRAVENOUS | Status: AC
Start: 2016-12-01 — End: ?

## 2016-12-01 MED ORDER — OXYCODONE 5 MG TAB
5 mg | Freq: Once | ORAL | Status: DC | PRN
Start: 2016-12-01 — End: 2016-12-01

## 2016-12-01 MED ORDER — DEXAMETHASONE SODIUM PHOSPHATE 4 MG/ML IJ SOLN
4 mg/mL | INTRAMUSCULAR | Status: DC | PRN
Start: 2016-12-01 — End: 2016-12-01
  Administered 2016-12-01: 17:00:00 via INTRAVENOUS

## 2016-12-01 MED ORDER — LACTATED RINGERS IV
INTRAVENOUS | Status: DC
Start: 2016-12-01 — End: 2016-12-01

## 2016-12-01 MED ORDER — ACETAMINOPHEN 1,000 MG/100 ML (10 MG/ML) IV
1000 mg/100 mL (10 mg/mL) | INTRAVENOUS | Status: AC
Start: 2016-12-01 — End: ?

## 2016-12-01 MED FILL — BRIDION 100 MG/ML INTRAVENOUS SOLUTION: 100 mg/mL | INTRAVENOUS | Qty: 4

## 2016-12-01 MED FILL — OFIRMEV 1,000 MG/100 ML (10 MG/ML) INTRAVENOUS SOLUTION: 1000 mg/100 mL (10 mg/mL) | INTRAVENOUS | Qty: 100

## 2016-12-01 MED FILL — CEFAZOLIN 2 GRAM/20 ML IN STERILE WATER INTRAVENOUS SYRINGE: 2 gram/0 mL | INTRAVENOUS | Qty: 20

## 2016-12-01 MED FILL — MARCAINE (PF) 0.25 % (2.5 MG/ML) INJECTION SOLUTION: 0.25 % (2.5 mg/mL) | INTRAMUSCULAR | Qty: 30

## 2016-12-01 MED FILL — HYDROMORPHONE (PF) 2 MG/ML IJ SOLN: 2 mg/mL | INTRAMUSCULAR | Qty: 1

## 2016-12-01 MED FILL — MIDAZOLAM 1 MG/ML IJ SOLN: 1 mg/mL | INTRAMUSCULAR | Qty: 2

## 2016-12-01 MED FILL — FENTANYL CITRATE (PF) 50 MCG/ML IJ SOLN: 50 mcg/mL | INTRAMUSCULAR | Qty: 2

## 2016-12-01 MED FILL — HEPARIN (PORCINE) 5,000 UNIT/ML IJ SOLN: 5000 unit/mL | INTRAMUSCULAR | Qty: 1

## 2016-12-01 MED FILL — FAMOTIDINE 20 MG TAB: 20 mg | ORAL | Qty: 1

## 2016-12-01 NOTE — Op Note (Signed)
OPERATIVE REPORT    NAME: Sandy Mahoney  MRN: [id number]  SEX: female  AGE: 51 y.o.    DATE OF OPERATION: 12/01/2016    PREOPERATIVE DIAGNOSIS: Umbilical hernia    POSTOPERATIVE DIAGNOSIS: Same as above    PROCEDURE: Open umbilical hernia repair with Ventralex mesh    SURGEON: Selena Lesser, MD    ANESTHESIA: General endotracheal anesthesia plus 20 mL 0.25% Marcaine  used as local anesthesia at the end of the procedure.    ESTIMATED BLOOD LOSS: less than 5 mL    SPECIMENS: None    HISTORY: The patient was seen in the office. I went through the procedure, the risks of bleeding, infection, anesthesia, injury to the small bowel, large bowel, and the  potential for recurrence. The patient agreed and the procedure was scheduled for today.    The patient was brought to the Operating Room and placed on  the operating room table in supine position.  A time out was performed to verify patient and procedure, after which general endotracheal anesthesia was administered without complications. The patient received prophylactic antibiotic coverage. The patient's abdomen, which had already been shaved, was then prepped and draped in usual  manner. A curvilinear incision was made at the hernia site above the umbilicus incising the skin and subcutaneous tissue. An umbilical hernia sac was taken off the overlying umbilical skin and soft tissue. The hernia sac was opened, and it was found to contain omentum and the omentum was resected, removed and residual tissue reduced back into the peritoneal cavity and the hernia sac was excised using electrocautery. The fascia around the hernia defect was cleared of surrounding tissues using electrocautery. A 4.3 cm circular piece of Ventralex mesh was placed into the peritoneal cavity.     The straps were used to pull the mesh up flush with the undersurface of the anterior abdominal wall and was tacked in place with 2-0 PDS through the fascia then the anterior surface of the mesh and back up  through the fascia and tied anteriorly.  I did this with the assistance of the other strap and then the straps were trimmed flush to the fascial opening.  I tacked at 3 and 9 o'clock with 2-0 PDS as well in the same fashion.  Once complete I ran the fascia closed with 0 PDS and tied it at the opposite end. I swept my finger around the mesh to ensure that is was not kinked and lying in good position prior to securing the mesh. The subcutaneous tissues were reapproximated with the 2-0 PDS interrupted and the Umbilical skin did have to be tacked down. The wound was irrigated with saline solution. The incision was closed with a 3-0 Vicryl in the subcutaneous tissues to close the space and a running subcuticular suture of 3-0 Vicryl. The patient received 20 mL of 0.25% Marcaine around the incision as local anesthesia. The incision was covered with Dermabond for the final dressing and closure. The patient tolerated the procedure well, was extubated and brought to the Recovery Room in stable condition.  All surgical counts were reported to be correct.        Selena Lesser, MD.

## 2016-12-01 NOTE — Interval H&P Note (Signed)
H&P Update:  Sandy Mahoney was seen and examined.  History and physical has been reviewed. The patient has been examined. There have been no significant clinical changes since the completion of the originally dated History and Physical.    Signed By: Selena Lesser, MD     December 01, 2016 10:52 AM

## 2016-12-01 NOTE — Op Note (Signed)
OPERATIVE REPORT    NAME: Sandy Mahoney  MRN: [id number]  SEX: female  AGE: 51 y.o.    DATE OF OPERATION: 12/01/2016    PREOPERATIVE DIAGNOSIS: Umbilical hernia    POSTOPERATIVE DIAGNOSIS: Same as above    PROCEDURE: Open umbilical hernia repair with Ventralex mesh    SURGEON: Selena Lesser, MD    ANESTHESIA: General endotracheal anesthesia plus 20 mL 0.25% Marcaine  used as local anesthesia at the end of the procedure.    ESTIMATED BLOOD LOSS: less than 5 mL    SPECIMENS: None    HISTORY: The patient was seen in the office. I went through the procedure, the risks of bleeding, infection, anesthesia, injury to the small bowel, large bowel, and the  potential for recurrence. The patient agreed and the procedure was scheduled for today.    The patient was brought to the Operating Room and placed on  the operating room table in supine position.  A time out was performed to verify patient and procedure, after which general endotracheal anesthesia was administered without complications. The patient received prophylactic antibiotic coverage. The patient's abdomen, which had already been shaved, was then prepped and draped in usual  manner. A curvilinear incision was made at the hernia site above the umbilicus incising the skin and subcutaneous tissue. An umbilical hernia sac was taken off the overlying umbilical skin and soft tissue. The hernia sac was opened, and it was found to contain omentum and the omentum was resected, removed and residual tissue reduced back into the peritoneal cavity and the hernia sac was excised using electrocautery. The fascia around the hernia defect was cleared of surrounding tissues using electrocautery. A 4.3 cm circular piece of Ventralex mesh was placed into the peritoneal cavity.     The straps were used to pull the mesh up flush with the undersurface of the anterior abdominal wall and was tacked in place with 2-0 PDS through  the fascia then the anterior surface of the mesh and back up through the fascia and tied anteriorly.  I did this with the assistance of the other strap and then the straps were trimmed flush to the fascial opening.  I tacked at 3 and 9 o'clock with 2-0 PDS as well in the same fashion.  Once complete I ran the fascia closed with 0 PDS and tied it at the opposite end. I swept my finger around the mesh to ensure that is was not kinked and lying in good position prior to securing the mesh. The subcutaneous tissues were reapproximated with the 2-0 PDS interrupted and the Umbilical skin did have to be tacked down. The wound was irrigated with saline solution. The incision was closed with a 3-0 Vicryl in the subcutaneous tissues to close the space and a running subcuticular suture of 3-0 Vicryl. The patient received 20 mL of 0.25% Marcaine around the incision as local anesthesia. The incision was covered with Dermabond for the final dressing and closure. The patient tolerated the procedure well, was extubated and brought to the Recovery Room in stable condition.  All surgical counts were reported to be correct.        Selena Lesser, MD.

## 2016-12-01 NOTE — Anesthesia Pre-Procedure Evaluation (Signed)
Anesthetic History   No history of anesthetic complications            Review of Systems / Medical History  Patient summary reviewed and pertinent labs reviewed    Pulmonary        Sleep apnea: No treatment           Neuro/Psych              Cardiovascular    Hypertension: well controlled          CAD and cardiac stents (DES 2016 on DAPT)    Exercise tolerance: >4 METS     GI/Hepatic/Renal     GERD: well controlled           Endo/Other        Morbid obesity     Other Findings              Physical Exam    Airway  Mallampati: II  TM Distance: 4 - 6 cm  Neck ROM: normal range of motion   Mouth opening: Normal     Cardiovascular  Regular rate and rhythm,  S1 and S2 normal,  no murmur, click, rub, or gallop             Dental    Dentition: Edentulous     Pulmonary  Breath sounds clear to auscultation               Abdominal  GI exam deferred       Other Findings            Anesthetic Plan    ASA: 3  Anesthesia type: general          Induction: Intravenous  Anesthetic plan and risks discussed with: Patient and Spouse

## 2016-12-01 NOTE — Anesthesia Post-Procedure Evaluation (Signed)
Post-Anesthesia Evaluation and Assessment    Patient: Sandy Mahoney MRN: [id number]  SSN: [id number]    Date of Birth: 11/25/1965  Age: 51 y.o.  Sex: female       Cardiovascular Function/Vital Signs  Visit Vitals   ??? BP 150/65   ??? Pulse 69   ??? Temp 36.7 ??C (98.1 ??F)   ??? Resp 16   ??? Ht 5' 6.5" (1.689 m)   ??? Wt 123.5 kg (272 lb 4.8 oz)   ??? SpO2 96%   ??? BMI 43.29 kg/m2       Patient is status post general anesthesia for Procedure(s):  HERNIA UMBILICAL REPAIR WITH MESH.    Nausea/Vomiting: None    Postoperative hydration reviewed and adequate.    Pain:  Pain Scale 1: Visual (12/01/16 1301)  Pain Intensity 1: 10 (12/01/16 1301)   Managed    Neurological Status:   Neuro (WDL): Exceptions to WDL (12/01/16 1255)  Neuro  Neurologic State: Drowsy (12/01/16 1255)  Orientation Level: Disoriented X4 (12/01/16 1255)   At baseline    Mental Status and Level of Consciousness: Arousable    Pulmonary Status:   O2 Device: Room air (12/01/16 1310)   Adequate oxygenation and airway patent    Complications related to anesthesia: None    Post-anesthesia assessment completed. No concerns    Signed By: Derry Lory., MD     December 01, 2016

## 2016-12-02 MED FILL — XYLOCAINE-MPF 20 MG/ML (2 %) INJECTION SOLUTION: 20 mg/mL (2 %) | INTRAMUSCULAR | Qty: 100

## 2016-12-02 MED FILL — DEXAMETHASONE SODIUM PHOSPHATE 4 MG/ML IJ SOLN: 4 mg/mL | INTRAMUSCULAR | Qty: 4

## 2016-12-02 MED FILL — PROPOFOL 10 MG/ML IV EMUL: 10 mg/mL | INTRAVENOUS | Qty: 200

## 2016-12-02 MED FILL — ROCURONIUM 10 MG/ML IV: 10 mg/mL | INTRAVENOUS | Qty: 50

## 2016-12-02 MED FILL — OFIRMEV 1,000 MG/100 ML (10 MG/ML) INTRAVENOUS SOLUTION: 1000 mg/100 mL (10 mg/mL) | INTRAVENOUS | Qty: 100

## 2016-12-02 MED FILL — ONDANSETRON (PF) 4 MG/2 ML INJECTION: 4 mg/2 mL | INTRAMUSCULAR | Qty: 4

## 2016-12-12 ENCOUNTER — Ambulatory Visit
Admit: 2016-12-12 | Discharge: 2016-12-12 | Payer: PRIVATE HEALTH INSURANCE | Attending: Surgery | Primary: Family Medicine

## 2016-12-12 DIAGNOSIS — K429 Umbilical hernia without obstruction or gangrene: Secondary | ICD-10-CM

## 2016-12-12 NOTE — Progress Notes (Signed)
Aseel Truxillo A. Konrad Dolores., MD  [facility]  [address]  [place], [place]  [address]  [phone]        Date: 12/12/2016      Name: Sandy Mahoney      DOB: 05-19-1966             None   Selena Lesser      CC:    Chief Complaint   Patient presents with   ??? Surgical Follow-up     open umbilical hernia with mesh       HPI:  The patient returns after umbilical hernia repair.  The patient has no complaints, is tolerating diet, ambulating without difficulty, and moving bowels.  Pain is minimal.    PMH:    Past Medical History:   Diagnosis Date   ??? Aneurysm (HCC)     pt reports 4 cm in lower part of heart. cardiologist monitors   ??? Arthritis    ??? CAD (coronary artery disease)     s/p PCI 2016. [place] cardiology consultants [place]    ??? GERD (gastroesophageal reflux disease)     diet controlled   ??? Headache     hx migraines   ??? Hypercholesterolemia    ??? Hypertension     controlled w/med   ??? Obesity, morbid (HCC) 12/01/2016   ??? Sleep apnea     non compliant with Cpap       PSH:    Past Surgical History:   Procedure Laterality Date   ??? CARDIAC SURG PROCEDURE UNLIST      heart cath 2016, 2017   ??? HX CESAREAN SECTION  1998,2000   ??? HX CHOLECYSTECTOMY  1988   ??? HX ORTHOPAEDIC Bilateral     knee arthroscopy   ??? HX ORTHOPAEDIC Right     2 fingers -fractured       MEDS:    Current Outpatient Prescriptions   Medication Sig   ??? oxyCODONE-acetaminophen (PERCOCET) 5-325 mg per tablet Take 1 Tab by mouth every four (4) hours as needed for Pain. Max Daily Amount: 6 Tabs. every 4-6hrs prn pain   ??? atorvastatin (LIPITOR) 80 mg tablet Take 80 mg by mouth daily.   ??? clopidogrel (PLAVIX) 75 mg tab Take 75 mg by mouth daily. Per anesthesia protocol: pt instructed to stop med 7 days prior to day of surgery.   ??? lisinopril (PRINIVIL, ZESTRIL) 20 mg tablet Take 20 mg by mouth daily.   ??? aspirin delayed-release 81 mg tablet Take 81 mg by mouth daily. Per  anesthesia protocol:instructed to take am of surgery.   ??? carvedilol (COREG) 3.125 mg tablet Take 3.125 mg by mouth two (2) times a day. Per anesthesia protocol:instructed to take am of surgery.     No current facility-administered medications for this visit.        ALLERGIES:      No Known Allergies      Physical Exam:     There were no vitals taken for this visit.    General: Alert oriented cooperative patient in no acute distress.   Neck: Supple, trachea midline, no appreciable thyromegaly  Resp: No JVD.  Breathing is  non-labored. Lungs clear to auscultation without wheezing or rhonchi   CV: RRR. No murmurs, rubs or gallops appreciated.  Abd: soft non-tender and non-distended without peritoneal signs. +bs    Skin/incision: healing    Assessment/Plan:  Sandy Mahoney is a 51 y.o. female who  is s/p open umbilical hernia repair 12/01/16.    1. Doing well.  2. The patient was given wound care and activity instructions.  3. Follow-up PRN    Mills Mitton A. Konrad Dolores, MD, FACS

## 2016-12-12 NOTE — Telephone Encounter (Signed)
Patient was contacted Via [employer] on 12/08/16 for confirmation of appointment. No answer.

## 2016-12-14 NOTE — Telephone Encounter (Signed)
Voice message no accepting calls. FMLA papers are ready

## 2016-12-20 NOTE — Telephone Encounter (Signed)
Needs a note with her restrictions. Instructed to pick up tommorrow

## 2016-12-26 ENCOUNTER — Ambulatory Visit
Admit: 2016-12-26 | Discharge: 2016-12-26 | Payer: PRIVATE HEALTH INSURANCE | Attending: Surgery | Primary: Family Medicine

## 2016-12-26 NOTE — Telephone Encounter (Signed)
Incision opened over the weekend & is red. Appt. given

## 2016-12-26 NOTE — Progress Notes (Signed)
Sandy Wangerin A. Konrad DoloresSelwyn, Jr., MD  [facility]  [address]  [place], [place]  [id number]  [phone]        Date: 12/26/2016      Name: Sandy Mahoney      DOB: 1966-05-14             None   Sandy Mahoney      CC:    Chief Complaint   Patient presents with   ??? Surgical Follow-up     umbilical hernia/check incision       HPI:  The patient returns after hernia repair. The patient has no complaints, is tolerating diet, ambulating without difficulty, and moving bowels.  Pain is minimal.    PMH:    Past Medical History:   Diagnosis Date   ??? Aneurysm (HCC)     pt reports 4 cm in lower part of heart. cardiologist monitors   ??? Arthritis    ??? CAD (coronary artery disease)     s/p PCI 2016. followed by cardiology consultants [place]    ??? GERD (gastroesophageal reflux disease)     diet controlled   ??? Headache     hx migraines   ??? Hypercholesterolemia    ??? Hypertension     controlled w/med   ??? Obesity, morbid (HCC) 12/01/2016   ??? Sleep apnea     non compliant with Cpap       PSH:    Past Surgical History:   Procedure Laterality Date   ??? CARDIAC SURG PROCEDURE UNLIST      heart cath 2016, 2017   ??? HX CESAREAN SECTION  1998,2000   ??? HX CHOLECYSTECTOMY  1988   ??? HX HERNIA REPAIR  12/01/2016    umbilical   ??? HX ORTHOPAEDIC Bilateral     knee arthroscopy   ??? HX ORTHOPAEDIC Right     2 fingers -fractured       MEDS:    Current Outpatient Prescriptions   Medication Sig   ??? oxyCODONE-acetaminophen (PERCOCET) 5-325 mg per tablet Take 1 Tab by mouth every four (4) hours as needed for Pain. Max Daily Amount: 6 Tabs. every 4-6hrs prn pain   ??? atorvastatin (LIPITOR) 80 mg tablet Take 80 mg by mouth daily.   ??? clopidogrel (PLAVIX) 75 mg tab Take 75 mg by mouth daily. Per anesthesia protocol: pt instructed to stop med 7 days prior to day of surgery.   ??? lisinopril (PRINIVIL, ZESTRIL) 20 mg tablet Take 20 mg by mouth daily.    ??? aspirin delayed-release 81 mg tablet Take 81 mg by mouth daily. Per anesthesia protocol:instructed to take am of surgery.   ??? carvedilol (COREG) 3.125 mg tablet Take 3.125 mg by mouth two (2) times a day. Per anesthesia protocol:instructed to take am of surgery.     No current facility-administered medications for this visit.        ALLERGIES:      No Known Allergies      Physical Exam:     There were no vitals taken for this visit.    General: Alert oriented cooperative patient in no acute distress.   Neck: Supple, trachea midline, no appreciable thyromegaly  Resp: No JVD.  Breathing is  non-labored. Lungs clear to auscultation without wheezing or rhonchi   CV: RRR. No murmurs, rubs or gallops appreciated.  Abd: soft non-tender and non-distended without peritoneal signs. +bs    Skin/incision: healing    Assessment/Plan:  Sandy Mahoney is a 51 y.o. female who is s/p umbilical hernia repair with mesh 12/01/16.    1. Doing well.  2. The patient was given wound care and activity instructions.  3. Follow-up PRN    Sandy Mahoney A. Konrad Dolores, MD, FACS

## 2017-01-11 NOTE — Telephone Encounter (Signed)
Unable to reach patient x 2 today, phone not able to accept any calls at this time and unable to leave a msg. wanted to let patient know "Attending Physician's Statement" completed.

## 2017-01-19 NOTE — Telephone Encounter (Signed)
Unable to reach patient, left msg stating attending physician's statement is completed and ready for pick up.  Copy made and placed in scanned.  Patient informed paper may be mailed or emailed to her instead of picking up.

## 2017-06-20 ENCOUNTER — Ambulatory Visit
Admit: 2017-06-20 | Discharge: 2017-06-20 | Payer: PRIVATE HEALTH INSURANCE | Attending: Family Medicine | Primary: Family Medicine

## 2017-06-20 DIAGNOSIS — I2510 Atherosclerotic heart disease of native coronary artery without angina pectoris: Secondary | ICD-10-CM

## 2017-06-20 MED ORDER — CARVEDILOL 3.125 MG TAB
3.125 mg | ORAL_TABLET | Freq: Two times a day (BID) | ORAL | 1 refills | Status: DC
Start: 2017-06-20 — End: 2017-06-27

## 2017-06-20 MED ORDER — ATORVASTATIN 80 MG TAB
80 mg | ORAL_TABLET | Freq: Every day | ORAL | 1 refills | Status: DC
Start: 2017-06-20 — End: 2018-04-17

## 2017-06-20 MED ORDER — CLOPIDOGREL 75 MG TAB
75 mg | ORAL_TABLET | Freq: Every day | ORAL | 1 refills | Status: DC
Start: 2017-06-20 — End: 2018-02-19

## 2017-06-20 NOTE — Patient Instructions (Signed)
Food as Fuel: Care Instructions  Your Care Instructions    A healthy, balanced diet gives your body nutrients. Nutrients are like fuel for your body. They give you energy. And they keep your heart beating, your brain active, and your muscles working. They also help to build and strengthen bones, muscles, and other body tissues.  Your body needs three major nutrients for energy. These are carbohydrate, protein, and fat.  ?? Carbohydrate provides energy for your brain, muscles, heart, and lungs. It is found in bread, cereal, rice, pasta, fruits, vegetables, milk, yogurt, and sugar.  ?? Protein provides energy and helps build and repair your body's cells. It is found in meat, poultry, fish, cooked dry beans, cheese, tofu and other soy products, nuts and seeds, and milk and milk products.  ?? Fat provides energy, helps build the covering around nerves in your body, and helps make hormones. Fat is found in butter, margarine, oil, mayonnaise, salad dressing, and nuts. It is also found in most foods that come from animals, such as meat and milk products. Many foods also have fat added to them.  Your body needs all three of these nutrients to be healthy. If you choose a good mix of foods, you can help your body get the right amounts of carbohydrate, protein, and fat. It can also keep you at a healthy weight.  Follow-up care is a key part of your treatment and safety. Be sure to make and go to all appointments, and call your doctor if you are having problems. It's also a good idea to know your test results and keep a list of the medicines you take.  How can you care for yourself at home?  Eat a balanced diet  ?? Try to eat variety of healthy foods every day. These include:  ? 5 to 8 ounces of bread, cereal, crackers, rice, or pasta. An ounce is about 1 slice of bread, ?? cup of breakfast cereal, or ?? cup of cooked rice, cereal, or pasta. Choose whole-grain products for at least half of your grain servings.   ? 2 to 3 cups of vegetables. Be sure to include:  ?? Dark green vegetables such as broccoli and spinach.  ?? Orange vegetables such as carrots and sweet potatoes.  ? 1?? to 2 cups of fresh, frozen, or canned fruit. A banana, an orange, or a large apple equals 1 cup.  ? 3 cups of nonfat or low-fat milk, yogurt, or other milk products.  ? 5 to 6?? ounces of protein foods. These include chicken, fish, lean meat, beans, nuts, and seeds. One egg equals 1 ounce of meat, poultry, or fish. A ?? cup of cooked beans equals 2 ounces of protein.  Stay fueled all day  ?? Start your day with breakfast. If you don't have time to sit down for a bowl of cereal in the morning, try something that you can eat "on the go." Try a piece of whole wheat bread with peanut butter or a container of yogurt with frozen berries mixed in.  ?? Eat regularly scheduled meals and snacks. If you miss a meal, you may overeat at the next meal. Or you may choose a less healthy snack.  ?? Drink enough water. (If you have kidney, heart, or liver disease and have to limit fluids, talk with your doctor before you increase your fluid intake.)  Where can you learn more?  Go to http://www.healthwise.net/[employer]Help[employer]ctions.  Enter Z059 in the search box to learn more about "Food   as Fuel: Care Instructions."  Current as of: November 17, 2016  Content Version: 11.8  ?? 2006-2018 [employer]wise[employer]. Care instructions adapted under license by [employer] (which disclaims liability or warranty for this information). If you have questions about a medical condition or this instruction, always ask your healthcare professional. Healthwise, Incorporated disclaims any warranty or liability for your use of this information.

## 2017-06-20 NOTE — Progress Notes (Signed)
[facility]  _______________________________________  Sandy MarseillesAndrew S. [facility], MD                 [address]        [place], [place]                     [place], [place] [id number]                                                                                    Phone: [phone]                                                                                    Fax: [phone]    Sandy Mahoney is a 51 y.o. female who is seen for evaluation of   Chief Complaint   Patient presents with   ??? Shortness of Breath     worse in the last month    ??? Sleep Problem     diffcultly staying asleep          HPI:     New patient here with multiple complaints:    DOE: She states that when she is moving around she will become dyspneic and will have to rest and take a deep breath. States she will have to breathe very deeply. She does not recall doing PFTs every. She states she had anginal symptoms before her PCI, these symptoms to not feel like the same.     No results found for: TSH, TSH2, TSH3, TSHP, TSHEXT, TSHEXT    Insomnia: States she has trouble staying asleep. Will wake herself up most nights, around 0200-0300 and will then be up for a little while before sleepig on the arm chair. States it is very easy for her to fall asleep during the day.     Obesity: She is up another 8# in the last 6 months. States she gained 60# since quitting smoking after her PCI.   Wt Readings from Last 3 Encounters:   06/20/17 283 lb (128.4 kg)   12/01/16 272 lb 4.8 oz (123.5 kg)   11/24/16 275 lb (124.7 kg)         CAD: Followed by Cardiology consultants in [place], had cath in 2016. She is on Lipitor 80 and ASA 81 for secondary prevention, along with Cored 3.125 and Lisinopril 20. BP is well controlled. She asks to be transferred to someone closer to [place], [place].     BP Readings from Last 3 Encounters:   06/20/17 130/89   12/01/16 109/63   11/15/16 124/62     HM:  Flu: Hasn't had one in a couple years.    Mammo: Due for this  Colo: Needs this as well    Review of Systems:  Review of Systems  Constitutional: Negative for chills and fever.   HENT: Negative for ear pain and hearing loss.    Eyes: Negative for blurred vision and double vision.   Respiratory: Positive for shortness of breath. Negative for cough.    Cardiovascular: Positive for leg swelling. Negative for chest pain and palpitations.   Gastrointestinal: Negative for abdominal pain, blood in stool, melena, nausea and vomiting.   Genitourinary: Negative for dysuria, frequency, hematuria and urgency.   Musculoskeletal: Negative for back pain and joint pain.   Skin: Negative for itching and rash.   Neurological: Negative for dizziness, tingling and headaches.   Psychiatric/Behavioral: Negative for depression and suicidal ideas.       History:  Past Medical History:   Diagnosis Date   ??? Aneurysm (HCC)     pt reports 4 cm in lower part of heart. cardiologist monitors   ??? Arthritis    ??? CAD (coronary artery disease)     s/p PCI 2016. [place] cardiology consultants [place]    ??? GERD (gastroesophageal reflux disease)     diet controlled   ??? Headache     hx migraines   ??? Hypercholesterolemia    ??? Hypertension     controlled w/med   ??? Obesity, morbid (HCC) 12/01/2016   ??? Sleep apnea     non compliant with Cpap       Past Surgical History:   Procedure Laterality Date   ??? CARDIAC SURG PROCEDURE UNLIST      heart cath 2016, 2017   ??? HX CESAREAN SECTION  1998,2000   ??? HX CHOLECYSTECTOMY  1988   ??? HX HERNIA REPAIR  12/01/2016    umbilical   ??? HX ORTHOPAEDIC Bilateral     knee arthroscopy   ??? HX ORTHOPAEDIC Right     2 fingers -fractured       Family History   Problem Relation Age of Onset   ??? Heart Disease Mother    ??? Other Mother         renal failure   ??? Diabetes Mother    ??? Hypertension Mother    ??? Diabetes Father    ??? Hypertension Father        Social History     Tobacco Use   ??? Smoking status: Former Smoker     Last attempt to quit: 08/29/2014      Years since quitting: 2.8   ??? Smokeless tobacco: Never Used   Substance Use Topics   ??? Alcohol use: No       No Known Allergies    Vitals:  Visit Vitals  BP 130/89   Pulse 85   Ht 5' 6.5" (1.689 m)   Wt 283 lb (128.4 kg)   SpO2 98%   BMI 44.99 kg/m??       Physical Exam:  Physical Exam   Constitutional: She is oriented to person, place, and time. She appears well-developed and well-nourished. No distress.   HENT:   Head: Normocephalic and atraumatic.   Nose: Nose normal.   Mouth/Throat: Oropharynx is clear and moist.   Few teeth, no tonsils  Thinning of hair   Eyes: Conjunctivae and EOM are normal. Pupils are equal, round, and reactive to light.   Neck: Normal range of motion. Neck supple.   Very thick neck   Cardiovascular: Normal rate, regular rhythm and normal heart sounds. Exam reveals no gallop and no friction rub.   No murmur heard.  Pulmonary/Chest: Effort normal. No respiratory distress. She  has no wheezes. She has no rales. She exhibits no tenderness.   Very long exp phase, will sigh every few minutes or so   Abdominal: Soft. Bowel sounds are normal. She exhibits no distension and no mass. There is no tenderness. There is no rebound and no guarding.   Morbidly obese   Musculoskeletal: Normal range of motion. She exhibits edema. She exhibits no tenderness or deformity.   Non pitting dependent edema of BL ankles   Neurological: She is alert and oriented to person, place, and time. She has normal reflexes. No cranial nerve deficit. Coordination normal.   Skin: Skin is warm and dry. No rash noted. She is not diaphoretic. No erythema.   Psychiatric: She has a normal mood and affect. Her behavior is normal.   Nursing note and vitals reviewed.      Assessment and Plan:   1. Coronary artery disease involving native coronary artery of native heart without angina pectoris  Her current DOE could be cardiac in nature, seems that she is overdue for  a checkup on her CAD, but wants to transfer care to a local cardiologist. I put a referral in for this, appreciate cardiology help.   - REFERRAL TO CARDIOLOGY  - METABOLIC PANEL, COMPREHENSIVE    2. Benign essential HTN  Stable. Continue current regimen, check renal function.    - METABOLIC PANEL, COMPREHENSIVE    3. 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 Will start with PFTs and if highly abnormal will consult pulm formally. She was an avid smoker before her PCI and subsequent smoking cessation 2 years ago, so could also have component of COPD. Appreciate pulm doing the PFTs to start to tease this apart. I also suspect she has OSA, so at some point depending on the PFT results and cardiology consult we will have to get her scheduled for a sleep study.   - REFERRAL TO PULMONARY DISEASE    4. Obesity, morbid (HCC)  As above for the breathing. Will check thyroid to r/o hypothyroidism.   - REFERRAL TO PULMONARY DISEASE  - TSH 3RD GENERATION    5. Screening mammogram, encounter for    - MAM MAMMO BI SCREENING INCL CAD; Future    6. Encounter for immunization    - INFLUENZA VIRUS VAC QUAD,SPLIT,PRESV FREE SYRINGE IM    FU 6w    Roberts Gaudy, MD

## 2017-06-21 LAB — METABOLIC PANEL, COMPREHENSIVE
A-G Ratio: 1.6 (ref 1.2–2.2)
ALT (SGPT): 25 IU/L (ref 0–32)
AST (SGOT): 18 IU/L (ref 0–40)
Albumin: 4.6 g/dL (ref 3.5–5.5)
Alk. phosphatase: 87 IU/L (ref 39–117)
BUN/Creatinine ratio: 13 (ref 9–23)
BUN: 12 mg/dL (ref 6–24)
Bilirubin, total: 0.4 mg/dL (ref 0.0–1.2)
CO2: 23 mmol/L (ref 20–29)
Calcium: 9.6 mg/dL (ref 8.7–10.2)
Chloride: 98 mmol/L (ref 96–106)
Creatinine: 0.89 mg/dL (ref 0.57–1.00)
GFR est AA: 87 mL/min/{1.73_m2} (ref >59)
GFR est non-AA: 75 mL/min/{1.73_m2} (ref >59)
GLOBULIN, TOTAL: 2.9 g/dL (ref 1.5–4.5)
Glucose: 86 mg/dL (ref 65–99)
Potassium: 4.6 mmol/L (ref 3.5–5.2)
Protein, total: 7.5 g/dL (ref 6.0–8.5)
Sodium: 140 mmol/L (ref 134–144)

## 2017-06-21 LAB — TSH 3RD GENERATION: TSH: 3.54 u[IU]/mL (ref 0.450–4.500)

## 2017-06-22 ENCOUNTER — Encounter

## 2017-06-27 ENCOUNTER — Encounter: Payer: PRIVATE HEALTH INSURANCE | Attending: Cardiovascular Disease | Primary: Family Medicine

## 2017-06-27 ENCOUNTER — Ambulatory Visit
Admit: 2017-06-27 | Discharge: 2017-06-27 | Payer: PRIVATE HEALTH INSURANCE | Attending: Cardiovascular Disease | Primary: Family Medicine

## 2017-06-27 DIAGNOSIS — I2510 Atherosclerotic heart disease of native coronary artery without angina pectoris: Secondary | ICD-10-CM

## 2017-06-27 MED ORDER — NITROGLYCERIN 0.4 MG SUBLINGUAL TAB
0.4 mg | ORAL_TABLET | SUBLINGUAL | 11 refills | Status: AC | PRN
Start: 2017-06-27 — End: ?

## 2017-06-27 MED ORDER — BUDESONIDE-FORMOTEROL HFA 80 MCG-4.5 MCG/ACTUATION AEROSOL INHALER
Freq: Two times a day (BID) | RESPIRATORY_TRACT | 11 refills | Status: AC
Start: 2017-06-27 — End: ?

## 2017-06-27 MED ORDER — METOPROLOL SUCCINATE SR 25 MG 24 HR TAB
25 mg | ORAL_TABLET | Freq: Every day | ORAL | 11 refills | Status: DC
Start: 2017-06-27 — End: 2017-09-29

## 2017-06-27 NOTE — Progress Notes (Signed)
[facility]  [address], [address]  [place] [address]  PHONE: [phone]    NAME: Sandy Mahoney  DOB: 1966/03/12     HPI  Sandy Mahoney is a 51 y.o. female seen for a follow up visit regarding   Coronary Artery Disease (consults) and Shortness of Breath     She is referred for evaluation and f/u on CAD. She has a history of UAP admit in [place].  She had one vessel stented. She apparently had her body scanned for clots and she was told that  she had a 4cm aneurysm ? 'in her heart'         She did quit smoking after this event.  She has htn and is compliant with her medications .    Currently she has been feeling dyspnea and taking extra breathes all the time.    She also has noted some left sided chest pains .  She has feels like she is having some sit on her chest . She has felt this way daily for a couple of months .  The symptoms have no aggravating or alleviating factors.   She has not tried ntg.  She is out.         Past Medical History, Past Surgical History, Family history, Social History, and Medications were all reviewed with the patient today and updated as necessary.     Outpatient Medications Marked as Taking for the 06/27/17 encounter (Office Visit) with Fenton Foy, MD   Medication Sig Dispense Refill   ??? metoprolol succinate (TOPROL-XL) 25 mg XL tablet Take 1 Tab by mouth daily. 90 Tab 11   ??? nitroglycerin (NITROSTAT) 0.4 mg SL tablet 1 Tab by SubLINGual route every five (5) minutes as needed for Chest Pain. Up to 3 doses. 25 Tab 11   ??? budesonide-formoterol (SYMBICORT) 80-4.5 mcg/actuation HFAA Take 2 Puffs by inhalation two (2) times a day. 1 Inhaler 11   ??? atorvastatin (LIPITOR) 80 mg tablet Take 1 Tab by mouth daily. 90 Tab 1   ??? clopidogrel (PLAVIX) 75 mg tab Take 1 Tab by mouth daily. 90 Tab 1   ??? lisinopril (PRINIVIL, ZESTRIL) 20 mg tablet Take 20 mg by mouth daily.     ??? aspirin delayed-release 81 mg tablet Take 81 mg by mouth daily. Per  anesthesia protocol:instructed to take am of surgery.       Patient Active Problem List    Diagnosis   ??? 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 4cm     will screen further with echo ? ascending aorta dilation described     ??? Shortness of breath   ??? Coronary artery disease involving native coronary artery of native heart without angina pectoris     LAD stenting 2016 mid vessel for ACS    minor disease at cath 2017 [place]  false positive nuke inferior ischemia      ??? Benign essential HTN   ??? Screening mammogram, encounter for   ??? Encounter for immunization   ??? Obesity, morbid ([place])     No Known Allergies  Past Medical History:   Diagnosis Date   ??? Aneurysm ([place])     pt reports 4 cm in lower part of heart. cardiologist monitors   ??? Arthritis    ??? CAD (coronary artery disease)     s/p PCI 2016. followed by cardiology consultants in [place].    ??? GERD (gastroesophageal reflux disease)  diet controlled   ??? Headache     hx migraines   ??? Hypercholesterolemia    ??? Hypertension     controlled w/med   ??? Obesity, morbid ([place]) 12/01/2016   ??? Sleep apnea     non compliant with Cpap     Past Surgical History:   Procedure Laterality Date   ??? CARDIAC SURG PROCEDURE UNLIST      heart cath 2016, 2017   ??? HX CESAREAN SECTION  1998,2000   ??? HX CHOLECYSTECTOMY  1988   ??? HX HERNIA REPAIR  46/96/2952    umbilical   ??? HX ORTHOPAEDIC Bilateral     knee arthroscopy   ??? HX ORTHOPAEDIC Right     2 fingers -fractured     Family History   Problem Relation Age of Onset   ??? Heart Disease Mother    ??? Other Mother         renal failure   ??? Diabetes Mother    ??? Hypertension Mother    ??? Diabetes Father    ??? Hypertension Father       Social History     Tobacco Use   ??? Smoking status: Former Smoker     Last attempt to quit: 08/29/2014     Years since quitting: 2.8   ??? Smokeless tobacco: Never Used   Substance Use Topics   ??? Alcohol use: No           Review of Systems   Constitutional: Negative for malaise/fatigue and weight loss.         Exercise capacity stable    Respiratory: Positive for shortness of breath. Negative for hemoptysis.    Cardiovascular: Positive for chest pain. Negative for palpitations.   Gastrointestinal: Negative for blood in stool.   Genitourinary: Negative for hematuria.   Musculoskeletal: Negative for falls.   Neurological: Negative for loss of consciousness.               Wt Readings from Last 3 Encounters:   06/27/17 287 lb 12.8 oz (130.5 kg)   06/20/17 283 lb (128.4 kg)   12/01/16 272 lb 4.8 oz (123.5 kg)     BP Readings from Last 3 Encounters:   06/27/17 136/80   06/20/17 130/89   12/01/16 109/63           Physical Exam   Constitutional: She appears well-developed and well-nourished.   obese    HENT:   Head: Normocephalic.   Neck: No JVD present. Carotid bruit is not present.   Cardiovascular: Normal rate, regular rhythm and normal heart sounds.   Pulmonary/Chest: Effort normal and breath sounds normal.   Abdominal: Soft.   Musculoskeletal: She exhibits no edema.   Neurological: She is alert.   Skin: Skin is warm and dry.   Psychiatric: She has a normal mood and affect.   EKG NSR hr 85bpm  WNL     Medical problems and test results were reviewed with the patient today.     No results found for any visits on 06/27/17.      No results found for: HBA1C, HGBE8, HBA1CPOC, HBA1CEXT, HBA1CEXT    Lab Results   Component Value Date/Time    HGB 12.8 11/25/2016 08:48 AM       Lab Results   Component Value Date/Time    Sodium 140 06/20/2017 11:30 AM    Potassium 4.6 06/20/2017 11:30 AM    Chloride 98 06/20/2017 11:30 AM    CO2 23 06/20/2017 11:30  AM    Glucose 86 06/20/2017 11:30 AM    BUN 12 06/20/2017 11:30 AM    Creatinine 0.89 06/20/2017 11:30 AM    BUN/Creatinine ratio 13 06/20/2017 11:30 AM    GFR est AA 87 06/20/2017 11:30 AM    GFR est non-AA 75 06/20/2017 11:30 AM    Calcium 9.6 06/20/2017 11:30 AM       Lab Results   Component Value Date/Time    ALT (SGPT) 25 06/20/2017 11:30 AM    AST (SGOT) 18 06/20/2017 11:30 AM     Alk. phosphatase 87 06/20/2017 11:30 AM    Bilirubin, total 0.4 06/20/2017 11:30 AM       No results found for: CHOL, CHOLPOCT, CHOLX, CHLST, CHOLV, HDL, HDLPOC, LDL, LDLCPOC, LDLC, DLDLP, VLDLC, VLDL, TGLX, TRIGL, TRIGP, TGLPOCT, CHHD, CHHDX      ASSESSMENT and PLAN    Diagnoses and all orders for this visit:    1. Coronary artery disease involving native coronary artery of native heart without angina pectoris.  She has atypical pain with hx of stent of her mid LAD with a f/u evaluation in [place] early 2017 including abnl nuke inferior ischemia but minor irregularity on cath with normal LV function .   -     AMB POC EKG ROUTINE W/ 12 LEADS, INTER & REP  -     metoprolol succinate (TOPROL-XL) 25 mg XL tablet; Take 1 Tab by mouth daily.  -     nitroglycerin (NITROSTAT) 0.4 mg SL tablet; 1 Tab by SubLINGual route every five (5) minutes as needed for Chest Pain. Up to 3 doses.  -     ECHO COMPLETE STUDY; Future    2. Benign essential HTN  -     metoprolol succinate (TOPROL-XL) 25 mg XL tablet; Take 1 Tab by mouth daily.  -     ECHO COMPLETE STUDY; Future    3. Obesity, morbid ([place])    4. Shortness of breath  likely some COPD with minor wheezing two years post smoking cessation   -     budesonide-formoterol (SYMBICORT) 80-4.5 mcg/actuation HFAA; Take 2 Puffs by inhalation two (2) times a day.       Thank you very much for your kind referral of this pleasant patient .  Please do not hesitate to call me  [phone] with any questions or suggestions.                    Follow-up Disposition: Not on [place], MD  06/27/2017  4:13 PM

## 2017-07-04 ENCOUNTER — Encounter: Payer: PRIVATE HEALTH INSURANCE | Primary: Family Medicine

## 2017-07-20 ENCOUNTER — Ambulatory Visit: Admit: 2017-07-20 | Discharge: 2017-07-20 | Payer: PRIVATE HEALTH INSURANCE | Primary: Family Medicine

## 2017-07-20 ENCOUNTER — Ambulatory Visit: Primary: Family Medicine

## 2017-07-20 DIAGNOSIS — R0609 Other forms of dyspnea: Secondary | ICD-10-CM

## 2017-07-20 LAB — AMB POC TOTAL HEMOGLOBIN (SPHB): Total Hemoglobin (SpHb)(POC): 11.1 g/dL (ref 8–17)

## 2017-07-20 NOTE — Progress Notes (Signed)
PFTs were nearly normal. It could be early COPD or mild obstruction from the weight she is carrying. At this point I advise she bring up the shortness of breath when she sees cardiology. It's been 2 years since her last cath, she may need another. If cardiology clears her, we should get a sleep study to rule out OSA. Thanks.

## 2017-07-20 NOTE — Progress Notes (Signed)
CPFT performed.

## 2017-08-03 ENCOUNTER — Ambulatory Visit
Admit: 2017-08-03 | Discharge: 2017-08-03 | Payer: PRIVATE HEALTH INSURANCE | Attending: Family Medicine | Primary: Family Medicine

## 2017-08-03 ENCOUNTER — Institutional Professional Consult (permissible substitution): Admit: 2017-08-03 | Discharge: 2017-08-03 | Payer: PRIVATE HEALTH INSURANCE | Primary: Family Medicine

## 2017-08-03 ENCOUNTER — Institutional Professional Consult (permissible substitution): Primary: Family Medicine

## 2017-08-03 DIAGNOSIS — I2510 Atherosclerotic heart disease of native coronary artery without angina pectoris: Secondary | ICD-10-CM

## 2017-08-03 DIAGNOSIS — I10 Essential (primary) hypertension: Secondary | ICD-10-CM

## 2017-08-03 NOTE — Progress Notes (Signed)
Echo completed. See interpretation scanned to the order.

## 2017-08-03 NOTE — Progress Notes (Signed)
Echo looks ok  heart strength size normal and no major valve problems .

## 2017-08-03 NOTE — Patient Instructions (Signed)
Plantar Fasciitis: Exercises  Your Care Instructions  Here are some examples of typical rehabilitation exercises for your condition. Start each exercise slowly. Ease off the exercise if you start to have pain.  Your doctor or physical therapist will tell you when you can start these exercises and which ones will work best for you.  How to do the exercises  Towel stretch    1. Sit with your legs extended and knees straight.  2. Place a towel around your foot just under the toes.  3. Hold each end of the towel in each hand, with your hands above your knees.  4. Pull back with the towel so that your foot stretches toward you.  5. Hold the position for at least 15 to 30 seconds.  6. Repeat 2 to 4 times a session, up to 5 sessions a day.    Calf stretch    1. Stand facing a wall with your hands on the wall at about eye level. Put the leg you want to stretch about a step behind your other leg.  2. Keeping your back heel on the floor, bend your front knee until you feel a stretch in the back leg.  3. Hold the stretch for 15 to 30 seconds. Repeat 2 to 4 times.    Plantar fascia and calf stretch    1. Stand on a step as shown above. Be sure to hold on to the banister.  2. Slowly let your heels down over the edge of the step as you relax your calf muscles. You should feel a gentle stretch across the bottom of your foot and up the back of your leg to your knee.  3. Hold the stretch about 15 to 30 seconds, and then tighten your calf muscle a little to bring your heel back up to the level of the step. Repeat 2 to 4 times.    Towel curls    1. While sitting, place your foot on a towel on the floor and scrunch the towel toward you with your toes.  2. Then, also using your toes, push the towel away from you.    Marble pickups    1. Put marbles on the floor next to a cup.  2. Using your toes, try to lift the marbles up from the floor and put them in the cup.     Follow-up care is a key part of your treatment and safety. Be sure to make and go to all appointments, and call your doctor if you are having problems. It's also a good idea to know your test results and keep a list of the medicines you take.  Where can you learn more?  Go to http://www.healthwise.net/[employer].  Enter X377 in the search box to learn more about "Plantar Fasciitis: Exercises."  Current as of: July 20, 2016  Content Version: 11.8  ?? 2006-2018 [employer]wise[employer]. Care instructions adapted under license by [employer] (which disclaims liability or warranty for this information). If you have questions about a medical condition or this instruction, always ask your healthcare professional. Healthwise, Incorporated disclaims any warranty or liability for your use of this information.

## 2017-08-03 NOTE — Progress Notes (Signed)
[facility]  _______________________________________  Sandy MarseillesAndrew S. McCraw, MD                 [address]        [place], [place]                     [place] [id number]                                                                                    Phone: [phone]                                                                                    Fax: [phone]    Sandy Mahoney is a 51 y.o. female who is seen for evaluation of   Chief Complaint   Patient presents with   ??? Coronary Artery Disease   ??? Hypertension   ??? Foot Pain     R foot          HPI:     CAD/HTN: She has been seen by Dr. Daleen Mahoney who went through her cardiac history and orfered an echo to be done in the future. For onw she is managed on ASA 81, Lipitor 80 for secondary prevention along with Plavix 75, lisinopril 20, Metoprolol 25 XL, and nitro. BP is normally nominal.   BP Readings from Last 3 Encounters:   08/03/17 126/84   06/27/17 136/80   06/20/17 130/89     Lab Results   Component Value Date/Time    Sodium 140 06/20/2017 11:30 AM    Potassium 4.6 06/20/2017 11:30 AM    Chloride 98 06/20/2017 11:30 AM    CO2 23 06/20/2017 11:30 AM    Glucose 86 06/20/2017 11:30 AM    BUN 12 06/20/2017 11:30 AM    Creatinine 0.89 06/20/2017 11:30 AM    BUN/Creatinine ratio 13 06/20/2017 11:30 AM    GFR est AA 87 06/20/2017 11:30 AM    GFR est non-AA 75 06/20/2017 11:30 AM    Calcium 9.6 06/20/2017 11:30 AM     COPD: PFTs were borderline, she was on symbicort and is no longer smoking. She states she cannot afford the symbicort that we ordered for her because of copays on her medications.     R foot pain: She states he has had "tendonitis, bursitis, heel spurs, and something going on with the achilles tendon." Pain is from the knee down but mainly in the forefoot radiating to the insertion of the achilles.     HM:  Mammo: In process  Colo: She is due for this    Review of Systems:  Review of Systems    Constitutional: Negative for chills and fever.   Respiratory: Negative for cough and shortness of breath.    Cardiovascular: Negative  for chest pain and palpitations.   Gastrointestinal: Negative for abdominal pain, nausea and vomiting.   Musculoskeletal: Positive for joint pain.       History:  Past Medical History:   Diagnosis Date   ??? Aneurysm ([facility])     pt reports 4 cm in lower part of heart. cardiologist monitors   ??? Arthritis    ??? CAD (coronary artery disease)     s/p PCI 2016. followed by cardiology consultants in [place]    ??? GERD (gastroesophageal reflux disease)     diet controlled   ??? Headache     hx migraines   ??? Hypercholesterolemia    ??? Hypertension     controlled w/med   ??? Obesity, morbid ([facility]) 12/01/2016   ??? Sleep apnea     non compliant with Cpap       Past Surgical History:   Procedure Laterality Date   ??? CARDIAC SURG PROCEDURE UNLIST      heart cath 2016, 2017   ??? HX CESAREAN SECTION  1998,2000   ??? HX CHOLECYSTECTOMY  1988   ??? HX HERNIA REPAIR  12/01/2016    umbilical   ??? HX ORTHOPAEDIC Bilateral     knee arthroscopy   ??? HX ORTHOPAEDIC Right     2 fingers -fractured       Family History   Problem Relation Age of Onset   ??? Heart Disease Mother    ??? Other Mother         renal failure   ??? Diabetes Mother    ??? Hypertension Mother    ??? Diabetes Father    ??? Hypertension Father        Social History     Tobacco Use   ??? Smoking status: Former Smoker     Last attempt to quit: 08/29/2014     Years since quitting: 2.9   ??? Smokeless tobacco: Never Used   Substance Use Topics   ??? Alcohol use: No       No Known Allergies    Vitals:  Visit Vitals  BP 126/84   Ht 5' 6.5" (1.689 m)   Wt 286 lb (129.7 kg)   BMI 45.47 kg/m??       Physical Exam:  Physical Exam   Constitutional: She is oriented to person, place, and time. She appears well-developed and well-nourished. No distress.   HENT:   Head: Normocephalic and atraumatic.   Nose: Nose normal.   Mouth/Throat: Oropharynx is clear and moist.   Few teeth, no tonsils   Thinning of hair   Eyes: Conjunctivae and EOM are normal. Pupils are equal, round, and reactive to light.   Neck: Normal range of motion. Neck supple.   Very thick neck   Cardiovascular: Normal rate, regular rhythm and normal heart sounds. Exam reveals no gallop and no friction rub.   No murmur heard.  Pulmonary/Chest: Effort normal. No respiratory distress. She has no wheezes. She has no rales. She exhibits no tenderness.   Very long exp phase   Abdominal: Soft. Bowel sounds are normal. She exhibits no distension and no mass. There is no tenderness. There is no rebound and no guarding.   Morbidly obese   Musculoskeletal: Normal range of motion. She exhibits edema and tenderness. She exhibits no deformity.   Non pitting dependent edema of BL ankles  TTP along R foot arch and anterior R heel  TTP at insertion of R achilles  Thompson test of the R achilles shows poor  excursion compared to contralateral side   Neurological: She is alert and oriented to person, place, and time. She has normal reflexes. No cranial nerve deficit. Coordination normal.   Skin: Skin is warm and dry. No rash noted. She is not diaphoretic. No erythema.   Psychiatric: She has a normal mood and affect. Her behavior is normal.   Nursing note and vitals reviewed.      Assessment and Plan:   1. Benign essential HTN  Stable, continue current regimen.       2. Coronary artery disease involving native coronary artery of native heart without angina pectoris  Stable, continue current regimen.       3. Simple chronic bronchitis ([facility])  I gave her more samples of symbicort. Will have to work on figuring out which inhaler(s) are preferred or if this is the lowest copay she can get in which case we can try for patient assistance.     4. Plantar fasciitis  Gave her exercises to do for this, it may be exacerbated by #5.   - REFERRAL TO ORTHOPEDICS    5. Achilles tendinitis of right lower extremity   She states she has bas heel spurs and based on her exam I do not doubt it. Have referred to ortho for their opinion, anticipate they will image her in their office. Appreciate ortho help.   - REFERRAL TO ORTHOPEDICS    6. Screen for colon cancer    - REFERRAL FOR COLONOSCOPY    FU [id number]    Roberts GaudyJason M Rolfe Hartsell, MD

## 2017-08-26 ENCOUNTER — Inpatient Hospital Stay: Admit: 2017-08-26 | Payer: PRIVATE HEALTH INSURANCE | Attending: Family Medicine | Primary: Family Medicine

## 2017-08-26 DIAGNOSIS — Z1231 Encounter for screening mammogram for malignant neoplasm of breast: Secondary | ICD-10-CM

## 2017-08-28 NOTE — Progress Notes (Signed)
Referral received for routine colonoscopy- screening or surveillance only.  Chart reviewed by me on August 28, 2017. initial    Pt found to be acceptable candidate for direct scheduling if they are interested with the following special instructions (if any): hold plavix

## 2017-09-22 NOTE — Telephone Encounter (Signed)
OK to hold plavix as outlined for procedure .  resume as as safe post       needs to continue asa 81mg 

## 2017-09-22 NOTE — Telephone Encounter (Signed)
Cardiac Clearance        Physician or Practice Requesting: Dr Towana Badger Person: Sandy Mahoney Phone Number: [phone]  Fax Number: [phone]  Date of Surgery/Procedure: 10/02/2017  Type of Surgery or Procedure:  COLO  Type of Anesthesia: MAC   Type of Clearance Requested: Cardiac Clearance and Medication Hold  Medication to Hold: plavix  Days to Hold: 5 days

## 2017-09-22 NOTE — Addendum Note (Signed)
Addended by: Braylon Grenda M on: 09/22/2017 10:45 AM     Modules accepted: Orders

## 2017-09-25 NOTE — Telephone Encounter (Signed)
I faxed surgical clearance letter to Dr.Jacques's office [facility]

## 2017-09-27 ENCOUNTER — Inpatient Hospital Stay: Primary: Family Medicine

## 2017-09-29 NOTE — Other (Addendum)
Patient verified name, DOB, and procedure.    Type: 1a; abbreviated assessment per anesthesia guidelines  Labs per surgeon: none  Labs per anesthesia: none  NOTE OKING TO HOLD PLAVIX IN CC FROM DR CEBE  Instructed pt that they will be notified via office/GI LAB for time of arrival to GI lab.     Follow diet and prep instructions per office. NPO after midnight. If patient has NOT received instructions from office patient is advised to call surgeon office, verbalizes understanding.    Bath or shower the night before and the am of surgery with non-mositurizing soap. No lotions, oils, powders, cologne on skin. No make up, eye make up or jewelry. Wear loose fitting comfortable, clean clothing.     Must have adult present in building the entire time .     Medications for the day of procedure asa 81 mg, symbicort, patient to hold pt stopped plavix 09/26/17 per dr [place] Bellsjacques    The following discharge instructions reviewed with patient: medication given during procedure may cause drowsiness for several hours, therefore, do not drive or operate machinery for remainder of the day. You may not drink alcohol on the day of your procedure, please resume regular diet and activity unless otherwise directed. You may experience abdominal distention for several hours that is relieved by the passage of gas. Contact your physician if you have any of the following: fever or chills, severe abdominal pain or excessive amount of bleeding or a large amount when having a bowel movement. Occasional specks of blood with bowel movement would not be unusual.

## 2017-10-02 ENCOUNTER — Inpatient Hospital Stay: Payer: PRIVATE HEALTH INSURANCE

## 2017-10-02 MED ORDER — PROPOFOL 10 MG/ML IV EMUL
10 mg/mL | INTRAVENOUS | Status: DC | PRN
Start: 2017-10-02 — End: 2017-10-02
  Administered 2017-10-02 (×2): via INTRAVENOUS

## 2017-10-02 MED ORDER — PROPOFOL 10 MG/ML IV EMUL
10 mg/mL | INTRAVENOUS | Status: DC | PRN
Start: 2017-10-02 — End: 2017-10-02
  Administered 2017-10-02: 20:00:00 via INTRAVENOUS

## 2017-10-02 MED ORDER — LIDOCAINE (PF) 20 MG/ML (2 %) IJ SOLN
20 mg/mL (2 %) | INTRAMUSCULAR | Status: DC | PRN
Start: 2017-10-02 — End: 2017-10-02
  Administered 2017-10-02: 20:00:00 via INTRAVENOUS

## 2017-10-02 MED ORDER — LACTATED RINGERS IV
INTRAVENOUS | Status: DC
Start: 2017-10-02 — End: 2017-10-02
  Administered 2017-10-02: 20:00:00 via INTRAVENOUS

## 2017-10-02 MED FILL — PROPOFOL 10 MG/ML IV EMUL: 10 mg/mL | INTRAVENOUS | Qty: 443.57

## 2017-10-02 MED FILL — XYLOCAINE-MPF 20 MG/ML (2 %) INJECTION SOLUTION: 20 mg/mL (2 %) | INTRAMUSCULAR | Qty: 100

## 2017-10-02 NOTE — Procedures (Signed)
Procedure in Detail:  Informed consent was obtained for the procedure.  The patient was placed in the left lateral decubitus position and sedation was induced by anesthesia.        The [id number] was inserted into the rectum and advanced under direct vision to the cecum, which was identified by the ileocecal valve and appendiceal orifice.  The quality of the colonic preparation was good.  A careful inspection was made as the colonoscope was withdrawn, including a retroflexed view of the rectum; findings and interventions are described below.  Appropriate photodocumentation was obtained.    Findings:   Rectum:   Normal  Sigmoid:     - Excavated lesions:     - Diverticulosis  Descending Colon:   Normal  Transverse Colon:   Normal  Ascending Colon:   Normal  Cecum:   Normal          Specimens: No specimens were collected.       Complications: None; patient tolerated the procedure well.\\    EBL - none    Recommendations:   - For colon cancer screening in this average-risk patient, colonoscopy may be repeated in 10 years.     - RESUME PLAVIX IMMEDIATELY     Signed By: Adalberto Illaniel M Keshawna Dix, MD                        October 02, 2017

## 2017-10-02 NOTE — Anesthesia Post-Procedure Evaluation (Signed)
Procedure(s):  COLONOSCOPY/ 45.    Anesthesia Post Evaluation        Patient location during evaluation: PACU  Patient participation: complete - patient participated  Level of consciousness: responsive to verbal stimuli  Pain management: adequate  Airway patency: patent  Anesthetic complications: no  Cardiovascular status: acceptable  Respiratory status: acceptable  Hydration status: euvolemic        Visit Vitals  BP 128/62   Pulse 68   Temp 36 ??C (96.8 ??F)   Resp 18   Ht 5' 6.5" (1.689 m)   Wt 128.4 kg (283 lb)   SpO2 99%   BMI 44.99 kg/m??

## 2017-10-02 NOTE — Anesthesia Pre-Procedure Evaluation (Signed)
Anesthetic History   No history of anesthetic complications            Review of Systems / Medical History  Patient summary reviewed and pertinent labs reviewed    Pulmonary    COPD: mild    Sleep apnea: No treatment           Neuro/Psych   Within defined limits           Cardiovascular    Hypertension: well controlled          CAD, cardiac stents (2016 - Plavix held, remains on bASA) and hyperlipidemia    Exercise tolerance: >4 METS     GI/Hepatic/Renal  Within defined limits              Endo/Other        Morbid obesity     Other Findings              Physical Exam    Airway  Mallampati: II  TM Distance: 4 - 6 cm  Neck ROM: normal range of motion   Mouth opening: Normal     Cardiovascular    Rhythm: regular  Rate: normal         Dental    Dentition: Edentulous     Pulmonary  Breath sounds clear to auscultation               Abdominal         Other Findings            Anesthetic Plan    ASA: 3  Anesthesia type: total IV anesthesia            Anesthetic plan and risks discussed with: Patient

## 2017-10-02 NOTE — Other (Signed)
Pt discharged home with boyfriend, boyfriend driving, VSS, instructions reviewed with Pt and boyfriend, understanding verbalized.  All belongings sent home with Pt.  Pt transported out via wheelchair by Trudie BucklerHeidi, Charity fundraiserN.

## 2017-10-02 NOTE — H&P (Signed)
History and Physical      Patient: Sandy Mahoney    Physician: Adalberto Ill, MD    Referring Physician: Roberts Gaudy, MD    Chief Complaint: For colonoscopy    History of Present Illness: Pt presents for colonoscopy.  Initial Screening for colorectal cancer .    History:  Past Medical History:   Diagnosis Date   ??? Aneurysm (HCC)     pt reports 4 cm in lower part of heart. --followed by dr Daleen Snook   ??? Arthritis    ??? CAD (coronary artery disease)     no mi--s/p PCI 2016. --- followed by dr Daleen Snook   ??? GERD (gastroesophageal reflux disease)     occ OTC med   ??? Hypercholesterolemia    ??? Hypertension     controlled w/med   ??? Migraine    ??? Obesity, morbid (HCC) 12/01/2016    bmi =45   ??? Sleep apnea     non compliant with Cpap-- one she has is broken and she never has gotten a replacement     Past Surgical History:   Procedure Laterality Date   ??? CARDIAC SURG PROCEDURE UNLIST      heart cath 2016, 2017   ??? HX CESAREAN SECTION  1998,2000   ??? HX CHOLECYSTECTOMY  1988   ??? HX HERNIA REPAIR  12/01/2016    umbilical   ??? HX ORTHOPAEDIC Bilateral     knee arthroscopy   ??? HX ORTHOPAEDIC Right     2 fingers -fractured      Social History     Socioeconomic History   ??? Marital status: SINGLE     Spouse name: Not on file   ??? Number of children: Not on file   ??? Years of education: Not on file   ??? Highest education level: Not on file   Tobacco Use   ??? Smoking status: Former Smoker     Packs/day: 2.00     Years: 23.00     Pack years: 46.00     Last attempt to quit: 08/29/2014     Years since quitting: 3.0   ??? Smokeless tobacco: Never Used   Substance and Sexual Activity   ??? Alcohol use: No   ??? Drug use: No      Family History   Problem Relation Age of Onset   ??? Heart Disease Mother    ??? Other Mother         renal failure   ??? Diabetes Mother    ??? Hypertension Mother    ??? Diabetes Father    ??? Hypertension Father        Medications:   Prior to Admission medications    Medication Sig Start Date End Date Taking? Authorizing Provider    budesonide-formoterol (SYMBICORT) 80-4.5 mcg/actuation HFAA Take 2 Puffs by inhalation two (2) times a day. 06/27/17  Yes Cebe, Balinda Quails, MD   atorvastatin (LIPITOR) 80 mg tablet Take 1 Tab by mouth daily.  Patient taking differently: Take 80 mg by mouth nightly. 06/20/17  Yes Coben, Elissa Hefty, MD   lisinopril (PRINIVIL, ZESTRIL) 20 mg tablet Take 20 mg by mouth daily.   Yes Provider, Historical   aspirin delayed-release 81 mg tablet Take 81 mg by mouth daily. Per anesthesia protocol:instructed to take am of surgery.   Yes Provider, Historical   nitroglycerin (NITROSTAT) 0.4 mg SL tablet 1 Tab by SubLINGual route every five (5) minutes as needed for Chest Pain. Up to  3 doses. 06/27/17   Seabron Spatesebe, John E, MD   clopidogrel (PLAVIX) 75 mg tab Take 1 Tab by mouth daily.  Patient taking differently: Take 75 mg by mouth daily. Stopped 09/26/17 06/20/17   Roberts Gaudyoben, Jason M, MD       Allergies: No Known Allergies    Physical Exam:     Vital Signs:   Visit Vitals  Temp 98.6 ??F (37 ??C)   Ht 5' 6.5" (1.689 m)   Wt 283 lb (128.4 kg)   BMI 44.99 kg/m??     .    General: no distress      Heart: regular   Lungs: unlabored   Abdominal: soft   Neurological: Grossly normal        Findings/Diagnosis: Screening for colorectal cancer; chronic anticoagulation    Plan of Care/Planned Procedure: Colonoscopy, possible polypectomy.  Pt/designee has reviewed the colonoscopy information sheet.  Any questions have been discussed.  They agree to proceed.      Signed:  Adalberto Illaniel M Joana Nolton, MD   10/02/2017

## 2017-10-02 NOTE — Procedures (Signed)
Procedure in Detail:  Informed consent was obtained for the procedure.  The patient was placed in the left lateral decubitus position and sedation was induced by anesthesia.        The CFHQ190L was inserted into the rectum and advanced under direct vision to the cecum, which was identified by the ileocecal valve and appendiceal orifice.  The quality of the colonic preparation was good.  A careful inspection was made as the colonoscope was withdrawn, including a retroflexed view of the rectum; findings and interventions are described below.  Appropriate photodocumentation was obtained.    Findings:   Rectum:   Normal  Sigmoid:     - Excavated lesions:     - Diverticulosis  Descending Colon:   Normal  Transverse Colon:   Normal  Ascending Colon:   Normal  Cecum:   Normal          Specimens: No specimens were collected.       Complications: None; patient tolerated the procedure well.\\    EBL - none    Recommendations:   - For colon cancer screening in this average-risk patient, colonoscopy may be repeated in 10 years.     - RESUME PLAVIX IMMEDIATELY     Signed By: Kamran Coker M December Hedtke, MD                        October 02, 2017

## 2017-11-02 ENCOUNTER — Ambulatory Visit
Admit: 2017-11-02 | Discharge: 2017-11-02 | Payer: PRIVATE HEALTH INSURANCE | Attending: Family Medicine | Primary: Family Medicine

## 2017-11-02 DIAGNOSIS — I10 Essential (primary) hypertension: Secondary | ICD-10-CM

## 2017-11-02 MED ORDER — MONTELUKAST 10 MG TAB
10 mg | ORAL_TABLET | Freq: Every day | ORAL | 1 refills | Status: AC
Start: 2017-11-02 — End: ?

## 2017-11-02 MED ORDER — AMOXICILLIN 500 MG CAP
500 mg | ORAL_CAPSULE | Freq: Two times a day (BID) | ORAL | 0 refills | Status: DC
Start: 2017-11-02 — End: 2018-01-12

## 2017-11-02 NOTE — Progress Notes (Signed)
[facility]  _______________________________________  Christiana Pellant, MD                 [address], [place] [address]                                                                                    Phone: [phone]                                                                                    Fax: [phone]    Sandy Mahoney is a 52 y.o. female who is seen for evaluation of   Chief Complaint   Patient presents with   ??? Fatigue   ??? Hypertension   ??? Coronary Artery Disease         HPI:     HTN/CAD: She is on Combo of Lipitor 80, Plavix 75, PRN nitro, ASA 81, and lisinopril 20. BP is well controlled. Hx of stent in the LAD in 2017, now follows with Dr. Colon Flattery at [facility] cardiology. Recent echo looked OK per him.     BP Readings from Last 3 Encounters:   11/02/17 116/82   10/02/17 128/62   08/03/17 126/84     Lab Results   Component Value Date/Time    Sodium 140 06/20/2017 11:30 AM    Potassium 4.6 06/20/2017 11:30 AM    Chloride 98 06/20/2017 11:30 AM    CO2 23 06/20/2017 11:30 AM    Glucose 86 06/20/2017 11:30 AM    BUN 12 06/20/2017 11:30 AM    Creatinine 0.89 06/20/2017 11:30 AM    BUN/Creatinine ratio 13 06/20/2017 11:30 AM    GFR est AA 87 06/20/2017 11:30 AM    GFR est non-AA 75 06/20/2017 11:30 AM    Calcium 9.6 06/20/2017 11:30 AM     Lab Results   Component Value Date/Time    ALT (SGPT) 25 06/20/2017 11:30 AM    AST (SGOT) 18 06/20/2017 11:30 AM    Alk. phosphatase 87 06/20/2017 11:30 AM    Bilirubin, total 0.4 06/20/2017 11:30 AM       Fatigue: She states she feels run down and achy all the time. She is unsure if she snores. Partner does not endorse apnea at night. She takes atorvastatin at night. Will still have some wheezing during the day.   Lab Results   Component Value Date/Time    TSH 3.540 06/20/2017 11:30 AM     Lab Results   Component Value Date/Time    HGB 12.8 11/25/2016 08:48 AM        COPD: She is on chronic Symbicort 80 daily.  She uses it every day when her breathing gets rough.     HM:  Pap: Has been many years.     Review of Systems:  Review of Systems   Constitutional: Positive for malaise/fatigue. Negative for chills and fever.   Respiratory: Negative for cough and shortness of breath.    Cardiovascular: Negative for chest pain and palpitations.   Gastrointestinal: Negative for abdominal pain, blood in stool, melena, nausea and vomiting.   Genitourinary: Negative for hematuria.       History:  Past Medical History:   Diagnosis Date   ??? Aneurysm ([place])     pt reports 4 cm in lower part of heart. --followed by dr Colon Flattery   ??? Arthritis    ??? CAD (coronary artery disease)     no mi--s/p PCI 2016. --- followed by dr Colon Flattery   ??? GERD (gastroesophageal reflux disease)     occ OTC med   ??? Hypercholesterolemia    ??? Hypertension     controlled w/med   ??? Migraine    ??? Obesity, morbid ([place]) 12/01/2016    bmi =45   ??? Sleep apnea     non compliant with Cpap-- one she has is broken and she never has gotten a replacement       Past Surgical History:   Procedure Laterality Date   ??? CARDIAC SURG PROCEDURE UNLIST      heart cath 2016, 2017   ??? COLONOSCOPY N/A 10/02/2017    COLONOSCOPY/ 45 performed by Azalia Bilis, [facility] at [facility] ENDOSCOPY   ??? HX CESAREAN SECTION  1998,2000   ??? HX CHOLECYSTECTOMY  1988   ??? HX HERNIA REPAIR  [id number]    umbilical   ??? HX ORTHOPAEDIC Bilateral     knee arthroscopy   ??? HX ORTHOPAEDIC Right     2 fingers -fractured   ??? PR COLONOSCOPY FLX DX W/COLLJ SPEC WHEN PFRMD  10/02/2017            Family History   Problem Relation Age of Onset   ??? Heart Disease Mother    ??? Other Mother         renal failure   ??? Diabetes Mother    ??? Hypertension Mother    ??? Diabetes Father    ??? Hypertension Father        Social History     Tobacco Use   ??? Smoking status: Former Smoker     Packs/day: 2.00     Years: 23.00     Pack years: 46.00     Last attempt to quit: 08/29/2014     Years since quitting: 3.1    ??? Smokeless tobacco: Never Used   Substance Use Topics   ??? Alcohol use: No       No Known Allergies    Vitals:  Visit Vitals  BP 116/82   Ht 5' 6.5" (1.689 m)   Wt 288 lb (130.6 kg)   BMI 45.79 kg/m??       Physical Exam:  Physical Exam   Constitutional: She is oriented to person, place, and time. She appears well-developed and well-nourished. No distress.   HENT:   Head: Normocephalic and atraumatic.   Nose: Nose normal.   Mouth/Throat: Oropharynx is clear and moist.   Few teeth, no tonsils  Thinning of hair  Suppurative effusion inferior TMs BL  Pale swollen nasal turbinated with PND   Eyes: Conjunctivae and EOM are normal. Pupils are equal, round, and reactive to  light.   Neck: Normal range of motion. Neck supple.   Very thick neck   Cardiovascular: Normal rate, regular rhythm and normal heart sounds. Exam reveals no gallop and no friction rub.   No murmur heard.  Pulmonary/Chest: Effort normal. No respiratory distress. She has no wheezes. She has no rales. She exhibits no tenderness.   Very long exp phase, will sigh every few minutes or so   Abdominal: Soft. Bowel sounds are normal. She exhibits no distension and no mass. There is no tenderness. There is no rebound and no guarding.   Morbidly obese   Musculoskeletal: Normal range of motion. She exhibits edema. She exhibits no tenderness or deformity.   Non pitting dependent edema of BL ankles   Neurological: She is alert and oriented to person, place, and time. She has normal reflexes. No cranial nerve deficit. Coordination normal.   Skin: Skin is warm and dry. No rash noted. She is not diaphoretic. No erythema.   Psychiatric: She has a normal mood and affect. Her behavior is normal.   Nursing note and vitals reviewed.      Assessment and Plan:   1. Benign essential HTN  Stable. Continue current regimen, check renal function.    - METABOLIC PANEL, COMPREHENSIVE    2. Coronary artery disease involving native coronary artery of native heart without angina pectoris   Stable, continue current regimen.     - METABOLIC PANEL, COMPREHENSIVE    3. Simple chronic bronchitis (HCC)  Stable, continue current regimen.   FU with Pulm as planned.     4. Chronic fatigue  If not anemic or hypothyroid, likely from deconditioning/COPD, may be a good candidate for pulm rehab.   - CBC WITH AUTOMATED DIFF  - TSH 3RD GENERATION    5. Bilateral non-suppurative otitis media  Amox x 10 days  - amoxicillin (AMOXIL) 500 mg capsule; Take 1 Cap by mouth two (2) times a day.  Dispense: 20 Cap; Refill: 0    6. Seasonal allergic rhinitis, unspecified trigger  Add singulair, should help mitigate against COPD exacerbations. Will monitor.   - montelukast (SINGULAIR) 10 mg tablet; Take 1 Tab by mouth daily.  Dispense: 90 Tab; Refill: 1    FU 7mfor GYN    JLaurel Dimmer MD

## 2017-11-02 NOTE — Progress Notes (Signed)
Labs look OK except her red blood cells are large. Combined with her fatigue I suspect B12 deficiency, recommend OTC B12 supplements daily. Should help with energy levels after several weeks.

## 2017-11-02 NOTE — Patient Instructions (Signed)
Fatigue: Care Instructions  Your Care Instructions    Fatigue is a feeling of tiredness, exhaustion, or lack of energy. You may feel fatigue because of too much or not enough activity. It can also come from stress, lack of sleep, boredom, and poor diet. Many medical problems, such as viral infections, can cause fatigue. Emotional problems, especially depression, are often the cause of fatigue.  Fatigue is most often a symptom of another problem. Treatment for fatigue depends on the cause. For example, if you have fatigue because you have a certain health problem, treating this problem also treats your fatigue. If depression or anxiety is the cause, treatment may help.  Follow-up care is a key part of your treatment and safety. Be sure to make and go to all appointments, and call your doctor if you are having problems. It's also a good idea to know your test results and keep a list of the medicines you take.  How can you care for yourself at home?  ?? Get regular exercise. But don't overdo it. Go back and forth between rest and exercise.  ?? Get plenty of rest.  ?? Eat a healthy diet. Do not skip meals, especially breakfast.  ?? Reduce your use of caffeine, tobacco, and alcohol. Caffeine is most often found in coffee, tea, cola drinks, and chocolate.  ?? Limit medicines that can cause fatigue. This includes tranquilizers and cold and allergy medicines.  When should you call for help?  Watch closely for changes in your health, and be sure to contact your doctor if:  ?? ?? You have new symptoms such as fever or a rash.   ?? ?? Your fatigue gets worse.   ?? ?? You have been feeling down, depressed, or hopeless. Or you may have lost interest in things that you usually enjoy.   ?? ?? You are not getting better as expected.   Where can you learn more?  Go to http://www.healthwise.net/GoodHelpConnections.  Enter W864 in the search box to learn more about "Fatigue: Care Instructions."  Current as of: May 14, 2017   Content Version: 11.9  ?? 2006-2018 [employer]wise[employer] Inc[employer]. Care instructions adapted under license by [employer] (which disclaims liability or warranty for this information). If you have questions about a medical condition or this instruction, always ask your healthcare professional. Healthwise, Incorporated disclaims any warranty or liability for your use of this information.

## 2017-11-03 LAB — CBC WITH AUTOMATED DIFF
ABS. BASOPHILS: 0.1 10*3/uL (ref 0.0–0.2)
ABS. EOSINOPHILS: 0.2 10*3/uL (ref 0.0–0.4)
ABS. IMM. GRANS.: 0 10*3/uL (ref 0.0–0.1)
ABS. MONOCYTES: 0.3 10*3/uL (ref 0.1–0.9)
ABS. NEUTROPHILS: 3.4 10*3/uL (ref 1.4–7.0)
Abs Lymphocytes: 1.9 10*3/uL (ref 0.7–3.1)
BASOPHILS: 2 %
EOSINOPHILS: 4 %
HCT: 38.1 % (ref 34.0–46.6)
HGB: 12.5 g/dL (ref 11.1–15.9)
IMMATURE GRANULOCYTES: 0 %
Lymphocytes: 31 %
MCH: 32.5 pg (ref 26.6–33.0)
MCHC: 32.8 g/dL (ref 31.5–35.7)
MCV: 99 fL — ABNORMAL HIGH (ref 79–97)
MONOCYTES: 5 %
NEUTROPHILS: 58 %
PLATELET: 327 10*3/uL (ref 150–379)
RBC: 3.85 x10E6/uL (ref 3.77–5.28)
RDW: 13.4 % (ref 12.3–15.4)
WBC: 5.9 10*3/uL (ref 3.4–10.8)

## 2017-11-03 LAB — METABOLIC PANEL, COMPREHENSIVE
A-G Ratio: 1.5 (ref 1.2–2.2)
ALT (SGPT): 28 IU/L (ref 0–32)
AST (SGOT): 18 IU/L (ref 0–40)
Albumin: 4.4 g/dL (ref 3.5–5.5)
Alk. phosphatase: 98 IU/L (ref 39–117)
BUN/Creatinine ratio: 10 (ref 9–23)
BUN: 10 mg/dL (ref 6–24)
Bilirubin, total: 0.4 mg/dL (ref 0.0–1.2)
CO2: 25 mmol/L (ref 20–29)
Calcium: 9.7 mg/dL (ref 8.7–10.2)
Chloride: 101 mmol/L (ref 96–106)
Creatinine: 0.98 mg/dL (ref 0.57–1.00)
GFR est AA: 77 mL/min/{1.73_m2} (ref >59)
GFR est non-AA: 67 mL/min/{1.73_m2} (ref >59)
GLOBULIN, TOTAL: 3 g/dL (ref 1.5–4.5)
Glucose: 100 mg/dL — ABNORMAL HIGH (ref 65–99)
Potassium: 4.7 mmol/L (ref 3.5–5.2)
Protein, total: 7.4 g/dL (ref 6.0–8.5)
Sodium: 142 mmol/L (ref 134–144)

## 2017-11-03 LAB — TSH 3RD GENERATION: TSH: 4.03 u[IU]/mL (ref 0.450–4.500)

## 2018-01-10 ENCOUNTER — Encounter: Payer: PRIVATE HEALTH INSURANCE | Attending: Family Medicine | Primary: Family Medicine

## 2018-01-12 ENCOUNTER — Ambulatory Visit
Admit: 2018-01-12 | Discharge: 2018-01-12 | Payer: PRIVATE HEALTH INSURANCE | Attending: Family Medicine | Primary: Family Medicine

## 2018-01-12 ENCOUNTER — Ambulatory Visit: Attending: Family Medicine | Primary: Family Medicine

## 2018-01-12 DIAGNOSIS — Z01419 Encounter for gynecological examination (general) (routine) without abnormal findings: Secondary | ICD-10-CM

## 2018-01-12 MED ORDER — CEPHALEXIN 500 MG CAP
500 mg | ORAL_CAPSULE | Freq: Three times a day (TID) | ORAL | 0 refills | Status: DC
Start: 2018-01-12 — End: 2018-04-17

## 2018-01-12 NOTE — Progress Notes (Signed)
Patient notified.

## 2018-01-12 NOTE — Progress Notes (Signed)
[facility]  _______________________________________  Sandy Marseilles, MD                 [address]        [place]. Karl Luke, [place]                     [place], [place] [address]                                                                                    Phone: [phone]                                                                                    Fax: [phone]    Sandy Mahoney is a 52 y.o. female who is seen for evaluation of   Chief Complaint   Patient presents with   ??? Gyn Exam         HPI:     Here for annual  Exam:    LMP: 3Y ago. No bleeding since then at all. No pelvic pain that she knows of.   Breasts: No issues, no lumps, masses, bumps, or drainage.     No Fhx of breast or ovarian cancer in her family. She had a normal mammo earlier this year. She still has a uterus.       Review of Systems:  Review of Systems   Constitutional: Negative for chills and fever.   Respiratory: Negative for cough and shortness of breath.    Cardiovascular: Negative for chest pain and palpitations.   Gastrointestinal: Negative for abdominal pain, blood in stool, melena, nausea and vomiting.   Genitourinary: Negative for hematuria.       History:  Past Medical History:   Diagnosis Date   ??? Aneurysm (HCC)     pt reports 4 cm in lower part of heart. --followed by dr Daleen Snook   ??? Arthritis    ??? CAD (coronary artery disease)     no mi--s/p PCI 2016. --- followed by dr Daleen Snook   ??? GERD (gastroesophageal reflux disease)     occ OTC med   ??? Hypercholesterolemia    ??? Hypertension     controlled w/med   ??? Migraine    ??? Obesity, morbid (HCC) 12/01/2016    bmi =45   ??? Sleep apnea     non compliant with Cpap-- one she has is broken and she never has gotten a replacement       Past Surgical History:   Procedure Laterality Date   ??? CARDIAC SURG PROCEDURE UNLIST      heart cath 2016, 2017   ??? COLONOSCOPY N/A 10/02/2017    COLONOSCOPY/ 45 performed by Adalberto Ill, MD at [facility] ENDOSCOPY   ??? HX CESAREAN  SECTION  1998,2000   ??? HX CHOLECYSTECTOMY  1988   ??? HX HERNIA REPAIR  12/01/2016    umbilical   ??? HX ORTHOPAEDIC Bilateral     knee arthroscopy   ??? HX ORTHOPAEDIC Right     2 fingers -fractured   ??? PR COLONOSCOPY FLX DX W/COLLJ SPEC WHEN PFRMD  10/02/2017            Family History   Problem Relation Age of Onset   ??? Heart Disease Mother    ??? Other Mother         renal failure   ??? Diabetes Mother    ??? Hypertension Mother    ??? Diabetes Father    ??? Hypertension Father        Social History     Tobacco Use   ??? Smoking status: Former Smoker     Packs/day: 2.00     Years: 23.00     Pack years: 46.00     Last attempt to quit: 08/29/2014     Years since quitting: 3.3   ??? Smokeless tobacco: Never Used   Substance Use Topics   ??? Alcohol use: No       No Known Allergies    Vitals:  Visit Vitals  BP 122/80   Ht 5' 5.5" (1.664 m)   Wt 289 lb (131.1 kg)   BMI 47.36 kg/m??       Physical Exam:  Physical Exam   Constitutional: She is oriented to person, place, and time. She appears well-developed and well-nourished. No distress.   HENT:   Head: Normocephalic and atraumatic.   Nose: Nose normal.   Mouth/Throat: Oropharynx is clear and moist.   Eyes: Pupils are equal, round, and reactive to light. Conjunctivae and EOM are normal.   Neck: Normal range of motion. Neck supple. No thyromegaly present.   Cardiovascular: Normal rate, regular rhythm and normal heart sounds. Exam reveals no gallop and no friction rub.   No murmur heard.  Pulmonary/Chest: Effort normal and breath sounds normal. No respiratory distress. She has no wheezes. She has no rales. Right breast exhibits no inverted nipple, no mass, no nipple discharge, no skin change and no tenderness. Left breast exhibits no inverted nipple, no mass, no nipple discharge, no skin change and no tenderness. No breast swelling, tenderness, discharge or bleeding. Breasts are symmetrical.   Abdominal: Soft. Bowel sounds are normal. She exhibits no distension and no mass. There is no  tenderness. There is no rebound and no guarding.   Obese   Genitourinary: Vagina normal and uterus normal. Rectal exam shows no mass and anal tone normal. There is no rash, tenderness, lesion or injury on the right labia. There is no rash, tenderness, lesion or injury on the left labia. Cervix exhibits no motion tenderness, no discharge and no friability. Right adnexum displays no mass, no tenderness and no fullness. Left adnexum displays no mass, no tenderness and no fullness. No erythema, tenderness or bleeding in the vagina. No foreign body in the vagina. No signs of injury around the vagina. No vaginal discharge found.   Genitourinary Comments: Difficult exam due to body habitus, hard to reach cervix with the standard sized speculums I have here   Musculoskeletal: Normal range of motion. She exhibits no edema or tenderness.   Neurological: She is alert and oriented to person, place, and time. She has normal reflexes. No cranial nerve deficit. Coordination normal.   Skin: Skin is warm and dry. No rash noted. She is not diaphoretic. No erythema.   There  is a small marble sized abscess in the R axilla that is draining serous fluid    Psychiatric: She has a normal mood and affect. Her behavior is normal.   Nursing note and vitals reviewed.      Assessment and Plan:   1. Well woman exam  Doing well from GU standpoint, no abnormalities on exam, will check pap and FU results.   - PAP (IMAGE GUIDED) + HPV HIGH RISK    2. Hidradenitis suppurativa of right axilla  Will do course of keflex. She will FU if lesions worsens.   - cephALEXin (KEFLEX) 500 mg capsule; Take 1 Cap by mouth three (3) times daily.  Dispense: 30 Cap; Refill: 0    FU 86m    Roberts Gaudy, MD

## 2018-01-12 NOTE — Progress Notes (Signed)
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 The guidelines are to repeat the pap in 1 year. 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 She should make her partner(s) aware that this test was positive. Please let her know, thanks.

## 2018-01-12 NOTE — Patient Instructions (Signed)
Pap Test: Care Instructions  Your Care Instructions    The Pap test (also called a Pap smear) is a screening test for cancer of the cervix, which is the lower part of the uterus that opens into the vagina. The test can help your doctor find early changes in the cells that could lead to cancer.  The sample of cells taken during your test has been sent to a lab so that an expert can look at the cells. It usually takes a week or two to get the results back.  Follow-up care is a key part of your treatment and safety. Be sure to make and go to all appointments, and call your doctor if you are having problems. It's also a good idea to know your test results and keep a list of the medicines you take.  What do the results mean?  ?? A normal result means that the test did not find any abnormal cells in the sample.  ?? An abnormal result can mean many things. Most of these are not cancer. The results of your test may be abnormal because:  ? You have an infection of the vagina or cervix, such as a yeast infection.  ? You have an IUD (intrauterine device for birth control).  ? You have low estrogen levels after menopause that are causing the cells to change.  ? You have cell changes that may be a sign of precancer or cancer. The results are ranked based on how serious the changes might be.  There are many other reasons why you might not get a normal result. If the results were abnormal, you may need to get another test within a few weeks or months. If the results show changes that could be a sign of cancer, you may need a test called a colposcopy, which provides a more complete view of the cervix.  Sometimes the lab cannot use the sample because it does not contain enough cells or was not preserved well. If so, you may need to have the test again. This is not common, but it does happen from time to time.  When should you call for help?  Watch closely for changes in your health, and be sure to contact your doctor if:   ?? ?? You have vaginal bleeding or pain for more than 2 days after the test. It is normal to have a small amount of bleeding for a day or two after the test.   Where can you learn more?  Go to http://www.healthwise.net/GoodHelpConnections.  Enter U972 in the search box to learn more about "Pap Test: Care Instructions."  Current as of: November 15, 2016  Content Version: 11.9  ?? 2006-2018 [employer], [employer]. Care instructions adapted under license by [employer] (which disclaims liability or warranty for this information). If you have questions about a medical condition or this instruction, always ask your healthcare professional. [employer]wise, [employer] disclaims any warranty or liability for your use of this information.

## 2018-01-12 NOTE — Progress Notes (Signed)
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 The guidelines are to repeat the pap in 1 year. 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 She should make her partner(s) aware that this test was positive. Please let her know, thanks.

## 2018-01-12 NOTE — Progress Notes (Addendum)
[facility]  _______________________________________  Cathlean Marseilles, MD                 [address]        [place]. Karl Luke, MD                     [place], [place] [address]                                                                                    Phone: [phone]                                                                                    Fax: [phone]    Nupur Hohman is a 52 y.o. female who is seen for evaluation of   Chief Complaint   Patient presents with   ??? Gyn Exam         HPI:     Here for annual  Exam:    LMP: 3Y ago. No bleeding since then at all. No pelvic pain that she knows of.   Breasts: No issues, no lumps, masses, bumps, or drainage.     No Fhx of breast or ovarian cancer in her family. She had a normal mammo earlier this year. She still has a uterus.       Review of Systems:  Review of Systems   Constitutional: Negative for chills and fever.   Respiratory: Negative for cough and shortness of breath.    Cardiovascular: Negative for chest pain and palpitations.   Gastrointestinal: Negative for abdominal pain, blood in stool, melena, nausea and vomiting.   Genitourinary: Negative for hematuria.       History:  Past Medical History:   Diagnosis Date   ??? Aneurysm (HCC)     pt reports 4 cm in lower part of heart. --followed by dr Daleen Snook   ??? Arthritis    ??? CAD (coronary artery disease)     no mi--s/p PCI 2016. --- followed by dr Daleen Snook   ??? GERD (gastroesophageal reflux disease)     occ OTC med   ??? Hypercholesterolemia    ??? Hypertension     controlled w/med   ??? Migraine    ??? Obesity, morbid (HCC) 12/01/2016    bmi =45   ??? Sleep apnea     non compliant with Cpap-- one she has is broken and she never has gotten a replacement       Past Surgical History:   Procedure Laterality Date   ??? CARDIAC SURG PROCEDURE UNLIST      heart cath 2016, 2017   ??? COLONOSCOPY N/A 10/02/2017    COLONOSCOPY/ 45 performed by Adalberto Ill, MD at [facility] ENDOSCOPY    ??? HX CESAREAN SECTION  1998,2000   ??? HX CHOLECYSTECTOMY  1988   ??? HX HERNIA REPAIR  12/01/2016    umbilical   ??? HX ORTHOPAEDIC Bilateral     knee arthroscopy   ??? HX ORTHOPAEDIC Right     2 fingers -fractured   ??? PR COLONOSCOPY FLX DX W/COLLJ SPEC WHEN PFRMD  10/02/2017            Family History   Problem Relation Age of Onset   ??? Heart Disease Mother    ??? Other Mother         renal failure   ??? Diabetes Mother    ??? Hypertension Mother    ??? Diabetes Father    ??? Hypertension Father        Social History     Tobacco Use   ??? Smoking status: Former Smoker     Packs/day: 2.00     Years: 23.00     Pack years: 46.00     Last attempt to quit: 08/29/2014     Years since quitting: 3.3   ??? Smokeless tobacco: Never Used   Substance Use Topics   ??? Alcohol use: No       No Known Allergies    Vitals:  Visit Vitals  BP 122/80   Ht 5' 5.5" (1.664 m)   Wt 289 lb (131.1 kg)   BMI 47.36 kg/m??       Physical Exam:  Physical Exam   Constitutional: She is oriented to person, place, and time. She appears well-developed and well-nourished. No distress.   HENT:   Head: Normocephalic and atraumatic.   Nose: Nose normal.   Mouth/Throat: Oropharynx is clear and moist.   Eyes: Pupils are equal, round, and reactive to light. Conjunctivae and EOM are normal.   Neck: Normal range of motion. Neck supple. No thyromegaly present.   Cardiovascular: Normal rate, regular rhythm and normal heart sounds. Exam reveals no gallop and no friction rub.   No murmur heard.  Pulmonary/Chest: Effort normal and breath sounds normal. No respiratory distress. She has no wheezes. She has no rales. Right breast exhibits no inverted nipple, no mass, no nipple discharge, no skin change and no tenderness. Left breast exhibits no inverted nipple, no mass, no nipple discharge, no skin change and no tenderness. No breast swelling, tenderness, discharge or bleeding. Breasts are symmetrical.   Abdominal: Soft. Bowel sounds are normal. She exhibits no distension and  no mass. There is no tenderness. There is no rebound and no guarding.   Obese   Genitourinary: Vagina normal and uterus normal. Rectal exam shows no mass and anal tone normal. There is no rash, tenderness, lesion or injury on the right labia. There is no rash, tenderness, lesion or injury on the left labia. Cervix exhibits no motion tenderness, no discharge and no friability. Right adnexum displays no mass, no tenderness and no fullness. Left adnexum displays no mass, no tenderness and no fullness. No erythema, tenderness or bleeding in the vagina. No foreign body in the vagina. No signs of injury around the vagina. No vaginal discharge found.   Genitourinary Comments: Difficult exam due to body habitus, hard to reach cervix with the standard sized speculums I have here   Musculoskeletal: Normal range of motion. She exhibits no edema or tenderness.   Neurological: She is alert and oriented to person, place, and time. She has normal reflexes. No cranial nerve deficit. Coordination normal.   Skin: Skin is warm and dry. No rash noted. She is not diaphoretic. No erythema.   There  is a small marble sized abscess in the R axilla that is draining serous fluid    Psychiatric: She has a normal mood and affect. Her behavior is normal.   Nursing note and vitals reviewed.      Assessment and Plan:   1. Well woman exam  Doing well from GU standpoint, no abnormalities on exam, will check pap and FU results.   - PAP (IMAGE GUIDED) + HPV HIGH RISK    2. Hidradenitis suppurativa of right axilla  Will do course of keflex. She will FU if lesions worsens.   - cephALEXin (KEFLEX) 500 mg capsule; Take 1 Cap by mouth three (3) times daily.  Dispense: 30 Cap; Refill: 0    FU 22m    Roberts Gaudy, MD

## 2018-01-17 LAB — PAP (IMAGE GUIDED) + HPV HIGH RISK
.: 0
HPV DNA Probe, High Risk: POSITIVE — AB
HPV, High Risk: POSITIVE — AB
LABCORP 019018: 0

## 2018-02-19 MED ORDER — CLOPIDOGREL 75 MG TAB
75 mg | ORAL_TABLET | Freq: Every day | ORAL | 1 refills | Status: DC
Start: 2018-02-19 — End: 2018-08-29

## 2018-04-16 NOTE — Telephone Encounter (Signed)
Patient had echo in December and was never contacted with results.  Please call patient with test results and also advise her of any follow required.

## 2018-04-17 ENCOUNTER — Ambulatory Visit
Admit: 2018-04-17 | Discharge: 2018-04-17 | Payer: PRIVATE HEALTH INSURANCE | Attending: Family Medicine | Primary: Family Medicine

## 2018-04-17 ENCOUNTER — Ambulatory Visit: Attending: Family Medicine | Primary: Family Medicine

## 2018-04-17 DIAGNOSIS — I2510 Atherosclerotic heart disease of native coronary artery without angina pectoris: Secondary | ICD-10-CM

## 2018-04-17 MED ORDER — ATORVASTATIN 80 MG TAB
80 mg | ORAL_TABLET | Freq: Every evening | ORAL | 1 refills | Status: AC
Start: 2018-04-17 — End: ?

## 2018-04-17 MED ORDER — DOXYCYCLINE 100 MG TAB
100 mg | ORAL_TABLET | Freq: Two times a day (BID) | ORAL | 0 refills | Status: DC
Start: 2018-04-17 — End: 2019-05-20

## 2018-04-17 NOTE — Progress Notes (Signed)
[facility]  _______________________________________  Christiana Pellant, MD                 [address], [place] [address]                                                                                    Phone: [phone]                                                                                    Fax: [phone]    Sandy Mahoney is a 52 y.o. female who is seen for evaluation of   Chief Complaint   Patient presents with   ??? Nausea   ??? Generalized Body Aches   ??? Hypertension   ??? Cholesterol Problem         HPI:     Was supposed to be here for regular FU but is sick today, complaining of flu like symptoms:    She has hx of COPD. Started being nauseated and having trouble catching her breath and feeling sore everywhere. She has not had emesis. No fevers either. No one else has been sick. She is coughing "all the time" but states this is normal for her. She is on Symbicort 80/4.5 2 puffs BID. Denies CP at this time, not using nitro. She is already on prednisone through Podiatry. Is in the middle of a dosepack.     HTN: She is on Lisinopril 20 for BP. Normally works pretty well for her.   BP Readings from Last 3 Encounters:   04/17/18 126/80   01/12/18 122/80   11/02/17 116/82         HLD: She is on Lipitor 80 nightly, also on ASA 81 and plavix and PRN nitro for CAD. Followed by cardiology.     Lab Results   Component Value Date/Time    Sodium 142 11/02/2017 09:52 AM    Potassium 4.7 11/02/2017 09:52 AM    Chloride 101 11/02/2017 09:52 AM    CO2 25 11/02/2017 09:52 AM    Glucose 100 (H) 11/02/2017 09:52 AM    BUN 10 11/02/2017 09:52 AM    Creatinine 0.98 11/02/2017 09:52 AM    BUN/Creatinine ratio 10 11/02/2017 09:52 AM    GFR est AA 77 11/02/2017 09:52 AM    GFR est non-AA 67 11/02/2017 09:52 AM    Calcium 9.7 11/02/2017 09:52 AM    Bilirubin, total 0.4 11/02/2017 09:52 AM    AST (SGOT) 18 11/02/2017 09:52 AM    Alk.  phosphatase 98 11/02/2017 09:52 AM    Protein, total 7.4 11/02/2017 09:52 AM  Albumin 4.4 11/02/2017 09:52 AM    A-G Ratio 1.5 11/02/2017 09:52 AM    ALT (SGPT) 28 11/02/2017 09:52 AM     Wt Readings from Last 3 Encounters:   04/17/18 297 lb (134.7 kg)   01/12/18 289 lb (131.1 kg)   11/02/17 288 lb (130.6 kg)     She is up 10#, is in a soft boot, cannot exercise. Podiatry has her in the soft boot because of pain, is scheduled for EMG.       Review of Systems:  Review of Systems   Constitutional: Positive for chills and malaise/fatigue. Negative for fever.   HENT: Positive for congestion.    Respiratory: Positive for cough and shortness of breath.    Cardiovascular: Negative for chest pain and palpitations.   Gastrointestinal: Negative for abdominal pain, blood in stool, melena, nausea and vomiting.   Genitourinary: Negative for hematuria.   Musculoskeletal: Positive for myalgias.       History:  Past Medical History:   Diagnosis Date   ??? Aneurysm ([facility])     pt reports 4 cm in lower part of heart. --followed by dr Colon Flattery   ??? Arthritis    ??? CAD (coronary artery disease)     no mi--s/p PCI 2016. --- followed by dr Colon Flattery   ??? GERD (gastroesophageal reflux disease)     occ OTC med   ??? Hypercholesterolemia    ??? Hypertension     controlled w/med   ??? Migraine    ??? Obesity, morbid ([facility]) 12/01/2016    bmi =45   ??? Sleep apnea     non compliant with Cpap-- one she has is broken and she never has gotten a replacement       Past Surgical History:   Procedure Laterality Date   ??? CARDIAC SURG PROCEDURE UNLIST      heart cath 2016, 2017   ??? COLONOSCOPY N/A 10/02/2017    COLONOSCOPY/ 45 performed by Azalia Bilis, MD at [facility] ENDOSCOPY   ??? HX CESAREAN SECTION  1998,2000   ??? HX CHOLECYSTECTOMY  1988   ??? HX HERNIA REPAIR  04/54/0981    umbilical   ??? HX ORTHOPAEDIC Bilateral     knee arthroscopy   ??? HX ORTHOPAEDIC Right     2 fingers -fractured   ??? PR COLONOSCOPY FLX DX W/COLLJ SPEC WHEN PFRMD  10/02/2017            Family History    Problem Relation Age of Onset   ??? Heart Disease Mother    ??? Other Mother         renal failure   ??? Diabetes Mother    ??? Hypertension Mother    ??? Diabetes Father    ??? Hypertension Father        Social History     Tobacco Use   ??? Smoking status: Former Smoker     Packs/day: 2.00     Years: 23.00     Pack years: 46.00     Last attempt to quit: 08/29/2014     Years since quitting: 3.6   ??? Smokeless tobacco: Never Used   Substance Use Topics   ??? Alcohol use: No       No Known Allergies    Vitals:  Visit Vitals  BP 126/80   Temp 97.9 ??F (36.6 ??C)   Ht 5' 5.5" (1.664 m)   Wt 297 lb (134.7 kg)   BMI 48.67 kg/m??  Physical Exam:  Physical Exam   Constitutional: She is oriented to person, place, and time. She appears well-developed and well-nourished. No distress.   HENT:   Head: Normocephalic and atraumatic.   Nose: Nose normal.   Mouth/Throat: Oropharynx is clear and moist.   Few teeth, no tonsils  Thinning of hair  Suppurative effusion inferior TMs BL  Pale swollen nasal turbinated with PND   Eyes: Pupils are equal, round, and reactive to light. Conjunctivae and EOM are normal.   Neck: Normal range of motion. Neck supple.   Very thick neck   Cardiovascular: Normal rate, regular rhythm and normal heart sounds. Exam reveals no gallop and no friction rub.   No murmur heard.  Pulmonary/Chest: Effort normal. No respiratory distress. She has wheezes. She has no rales. She exhibits no tenderness.   Very long exp phase, fine exp wheezes in the bases   Abdominal: Soft. Bowel sounds are normal. She exhibits no distension and no mass. There is no tenderness. There is no rebound and no guarding.   Morbidly obese   Musculoskeletal: Normal range of motion. She exhibits no edema, tenderness or deformity.   Wearing soft boot right foot   Neurological: She is alert and oriented to person, place, and time. She has normal reflexes. No cranial nerve deficit. Coordination normal.   Skin: Skin is warm and dry. No rash noted. She is not  diaphoretic. No erythema.   Psychiatric: She has a normal mood and affect. Her behavior is normal.   Nursing note and vitals reviewed.      Assessment and Plan:   1. Coronary artery disease involving native coronary artery of native heart without angina pectoris  Stable, continue current regimen.   FU with cardiology as planned.   - atorvastatin (LIPITOR) 80 mg tablet; Take 1 Tab by mouth nightly.  Dispense: 90 Tab; Refill: 1  - CBC WITH AUTOMATED DIFF    2. Benign essential HTN  Stable. Continue current regimen, check renal function.    - METABOLIC PANEL, COMPREHENSIVE    3. Obesity, morbid ([place])  She is very restricted on movements while podiatry works on her right foot (I am going to attempt to get these notes). Prednisone not helping with appetite. Will work on weight loss more when I see her back. Her husband is on a pretty strict diet and losing weight. He and I will both continue to motivate her.     4. COPD exacerbation ([place])  Already on prednisone, told her she could use her symbicort as a recuse inhaler if needed (she denies coughing paroxysms). Add doxy. FU end of the week if not any better.   - doxycycline (ADOXA) 100 mg tablet; Take 1 Tab by mouth two (2) times a day.  Dispense: 20 Tab; Refill: 0    FU 32mif labs OK    JLaurel Dimmer MD

## 2018-04-17 NOTE — Progress Notes (Signed)
[facility]  _______________________________________  Christiana Pellant, MD                 [address], [place] [address]                                                                                    Phone: [phone]                                                                                    Fax: [phone]    Sandy Mahoney is a 52 y.o. female who is seen for evaluation of   Chief Complaint   Patient presents with   ??? Nausea   ??? Generalized Body Aches   ??? Hypertension   ??? Cholesterol Problem         HPI:     Was supposed to be here for regular FU but is sick today, complaining of flu like symptoms:    She has hx of COPD. Started being nauseated and having trouble catching her breath and feeling sore everywhere. She has not had emesis. No fevers either. No one else has been sick. She is coughing "all the time" but states this is normal for her. She is on Symbicort 80/4.5 2 puffs BID. Denies CP at this time, not using nitro. She is already on prednisone through Podiatry. Is in the middle of a dosepack.     HTN: She is on Lisinopril 20 for BP. Normally works pretty well for her.   BP Readings from Last 3 Encounters:   04/17/18 126/80   01/12/18 122/80   11/02/17 116/82         HLD: She is on Lipitor 80 nightly, also on ASA 81 and plavix and PRN nitro for CAD. Followed by cardiology.     Lab Results   Component Value Date/Time    Sodium 142 11/02/2017 09:52 AM    Potassium 4.7 11/02/2017 09:52 AM    Chloride 101 11/02/2017 09:52 AM    CO2 25 11/02/2017 09:52 AM    Glucose 100 (H) 11/02/2017 09:52 AM    BUN 10 11/02/2017 09:52 AM    Creatinine 0.98 11/02/2017 09:52 AM    BUN/Creatinine ratio 10 11/02/2017 09:52 AM    GFR est AA 77 11/02/2017 09:52 AM    GFR est non-AA 67 11/02/2017 09:52 AM    Calcium 9.7 11/02/2017 09:52 AM    Bilirubin, total 0.4 11/02/2017 09:52 AM    AST (SGOT) 18 11/02/2017 09:52 AM     Alk. phosphatase 98 11/02/2017 09:52 AM    Protein, total 7.4 11/02/2017 09:52 AM  Albumin 4.4 11/02/2017 09:52 AM    A-G Ratio 1.5 11/02/2017 09:52 AM    ALT (SGPT) 28 11/02/2017 09:52 AM     Wt Readings from Last 3 Encounters:   04/17/18 297 lb (134.7 kg)   01/12/18 289 lb (131.1 kg)   11/02/17 288 lb (130.6 kg)     She is up 10#, is in a soft boot, cannot exercise. Podiatry has her in the soft boot because of pain, is scheduled for EMG.       Review of Systems:  Review of Systems   Constitutional: Positive for chills and malaise/fatigue. Negative for fever.   HENT: Positive for congestion.    Respiratory: Positive for cough and shortness of breath.    Cardiovascular: Negative for chest pain and palpitations.   Gastrointestinal: Negative for abdominal pain, blood in stool, melena, nausea and vomiting.   Genitourinary: Negative for hematuria.   Musculoskeletal: Positive for myalgias.       History:  Past Medical History:   Diagnosis Date   ??? Aneurysm ([facility])     pt reports 4 cm in lower part of heart. --followed by dr Colon Flattery   ??? Arthritis    ??? CAD (coronary artery disease)     no mi--s/p PCI 2016. --- followed by dr Colon Flattery   ??? GERD (gastroesophageal reflux disease)     occ OTC med   ??? Hypercholesterolemia    ??? Hypertension     controlled w/med   ??? Migraine    ??? Obesity, morbid ([facility]) 12/01/2016    bmi =45   ??? Sleep apnea     non compliant with Cpap-- one she has is broken and she never has gotten a replacement       Past Surgical History:   Procedure Laterality Date   ??? CARDIAC SURG PROCEDURE UNLIST      heart cath 2016, 2017   ??? COLONOSCOPY N/A 10/02/2017    COLONOSCOPY/ 45 performed by Azalia Bilis, MD at [facility] ENDOSCOPY   ??? HX CESAREAN SECTION  1998,2000   ??? HX CHOLECYSTECTOMY  1988   ??? HX HERNIA REPAIR  04/54/0981    umbilical   ??? HX ORTHOPAEDIC Bilateral     knee arthroscopy   ??? HX ORTHOPAEDIC Right     2 fingers -fractured   ??? PR COLONOSCOPY FLX DX W/COLLJ SPEC WHEN PFRMD  10/02/2017            Family History    Problem Relation Age of Onset   ??? Heart Disease Mother    ??? Other Mother         renal failure   ??? Diabetes Mother    ??? Hypertension Mother    ??? Diabetes Father    ??? Hypertension Father        Social History     Tobacco Use   ??? Smoking status: Former Smoker     Packs/day: 2.00     Years: 23.00     Pack years: 46.00     Last attempt to quit: 08/29/2014     Years since quitting: 3.6   ??? Smokeless tobacco: Never Used   Substance Use Topics   ??? Alcohol use: No       No Known Allergies    Vitals:  Visit Vitals  BP 126/80   Temp 97.9 ??F (36.6 ??C)   Ht 5' 5.5" (1.664 m)   Wt 297 lb (134.7 kg)   BMI 48.67 kg/m??  Physical Exam:  Physical Exam   Constitutional: She is oriented to person, place, and time. She appears well-developed and well-nourished. No distress.   HENT:   Head: Normocephalic and atraumatic.   Nose: Nose normal.   Mouth/Throat: Oropharynx is clear and moist.   Few teeth, no tonsils  Thinning of hair  Suppurative effusion inferior TMs BL  Pale swollen nasal turbinated with PND   Eyes: Pupils are equal, round, and reactive to light. Conjunctivae and EOM are normal.   Neck: Normal range of motion. Neck supple.   Very thick neck   Cardiovascular: Normal rate, regular rhythm and normal heart sounds. Exam reveals no gallop and no friction rub.   No murmur heard.  Pulmonary/Chest: Effort normal. No respiratory distress. She has wheezes. She has no rales. She exhibits no tenderness.   Very long exp phase, fine exp wheezes in the bases   Abdominal: Soft. Bowel sounds are normal. She exhibits no distension and no mass. There is no tenderness. There is no rebound and no guarding.   Morbidly obese   Musculoskeletal: Normal range of motion. She exhibits no edema, tenderness or deformity.   Wearing soft boot right foot   Neurological: She is alert and oriented to person, place, and time. She has normal reflexes. No cranial nerve deficit. Coordination normal.    Skin: Skin is warm and dry. No rash noted. She is not diaphoretic. No erythema.   Psychiatric: She has a normal mood and affect. Her behavior is normal.   Nursing note and vitals reviewed.      Assessment and Plan:   1. Coronary artery disease involving native coronary artery of native heart without angina pectoris  Stable, continue current regimen.   FU with cardiology as planned.   - atorvastatin (LIPITOR) 80 mg tablet; Take 1 Tab by mouth nightly.  Dispense: 90 Tab; Refill: 1  - CBC WITH AUTOMATED DIFF    2. Benign essential HTN  Stable. Continue current regimen, check renal function.    - METABOLIC PANEL, COMPREHENSIVE    3. Obesity, morbid ([facility] [place])  She is very restricted on movements while podiatry works on her right foot (I am going to attempt to get these notes). Prednisone not helping with appetite. Will work on weight loss more when I see her back. Her husband is on a pretty strict diet and losing weight. He and I will both continue to motivate her.     4. COPD exacerbation ([place])  Already on prednisone, told her she could use her symbicort as a recuse inhaler if needed (she denies coughing paroxysms). Add doxy. FU end of the week if not any better.   - doxycycline (ADOXA) 100 mg tablet; Take 1 Tab by mouth two (2) times a day.  Dispense: 20 Tab; Refill: 0    FU 89mif labs OK    JLaurel Dimmer MD

## 2018-04-17 NOTE — Patient Instructions (Signed)
Influenza (Flu) Vaccine (Inactivated or Recombinant): What You Need to Know  Why get vaccinated?  Influenza ("flu") is a contagious disease that spreads around the [place] every winter, usually between October and May.  Flu is caused by influenza viruses and is spread mainly by coughing, sneezing, and close contact.  Anyone can get flu. Flu strikes suddenly and can last several days. Symptoms vary by age, but can include:  ?? Fever/chills.  ?? Sore throat.  ?? Muscle aches.  ?? Fatigue.  ?? Cough.  ?? Headache.  ?? Runny or stuffy nose.  Flu can also lead to pneumonia and blood infections, and cause diarrhea and seizures in children. If you have a medical condition, such as heart or lung disease, flu can make it worse.  Flu is more dangerous for some people. Infants and young children, people 65 years of age and older, pregnant women, and people with certain health conditions or a weakened immune system are at greatest risk.  Each year thousands of people in the [place] die from flu, and many more are hospitalized.  Flu vaccine can:  ?? Keep you from getting flu.  ?? Make flu less severe if you do get it.  ?? Keep you from spreading flu to your family and other people.  Inactivated and recombinant flu vaccines  A dose of flu vaccine is recommended every flu season. Children 6 months through 8 years of age may need two doses during the same flu season. Everyone else needs only one dose each flu season.  Some inactivated flu vaccines contain a very small amount of a mercury-based preservative called thimerosal. Studies have not shown thimerosal in vaccines to be harmful, but flu vaccines that do not contain thimerosal are available.  There is no live flu virus in flu shots. They cannot cause the flu.  There are many flu viruses, and they are always changing. Each year a new flu vaccine is made to protect against three or four viruses that are likely to cause disease in the upcoming flu season. But even when the  vaccine doesn't exactly match these viruses, it may still provide some protection.  Flu vaccine cannot prevent:  ?? Flu that is caused by a virus not covered by the vaccine.  ?? Illnesses that look like flu but are not.  Some people should not get this vaccine  Tell the person who is giving you the vaccine:  ?? If you have any severe (life-threatening) allergies. If you ever had a life-threatening allergic reaction after a dose of flu vaccine, or have a severe allergy to any part of this vaccine, you may be advised not to get vaccinated. Most, but not all, types of flu vaccine contain a small amount of egg protein.  ?? If you ever had Guillain-Barr?? syndrome (also called GBS) Some people with a history of GBS should not get this vaccine. This should be discussed with your doctor.  ?? If you are not feeling well. It is usually okay to get flu vaccine when you have a mild illness, but you might be asked to come back when you feel better.  Risks of a vaccine reaction  With any medicine, including vaccines, there is a chance of reactions. These are usually mild and go away on their own, but serious reactions are also possible.  Most people who get a flu shot do not have any problems with it.  Minor problems following a flu shot include:  ?? Soreness, redness, or swelling   where the shot was given  ?? Hoarseness  ?? Sore, red or itchy eyes  ?? Cough  ?? Fever  ?? Aches  ?? Headache  ?? Itching  ?? Fatigue  If these problems occur, they usually begin soon after the shot and last 1 or 2 days.  More serious problems following a flu shot can include the following:  ?? There may be a small increased risk of Guillain-Barr?? Syndrome (GBS) after inactivated flu vaccine. This risk has been estimated at 1 or 2 additional cases per million people vaccinated. This is much lower than the risk of severe complications from flu, which can be prevented by flu vaccine.  ?? Young children who get the flu shot along with pneumococcal vaccine  (PCV13) and/or DTaP vaccine at the same time might be slightly more likely to have a seizure caused by fever. Ask your doctor for more information. Tell your doctor if a child who is getting flu vaccine has ever had a seizure  Problems that could happen after any injected vaccine:  ?? People sometimes faint after a medical procedure, including vaccination. Sitting or lying down for about 15 minutes can help prevent fainting, and injuries caused by a fall. Tell your doctor if you feel dizzy, or have vision changes or ringing in the ears.  ?? Some people get severe pain in the shoulder and have difficulty moving the arm where a shot was given. This happens very rarely.  ?? Any medication can cause a severe allergic reaction. Such reactions from a vaccine are very rare, estimated at about 1 in a million doses, and would happen within a few minutes to a few hours after the vaccination.  As with any medicine, there is a very remote chance of a vaccine causing a serious injury or death.  The safety of vaccines is always being monitored. For more information, visit: www.cdc.gov/vaccinesafety/.  What if there is a serious reaction?  What should I look for?  ?? Look for anything that concerns you, such as signs of a severe allergic reaction, very high fever, or unusual behavior.  Signs of a severe allergic reaction can include hives, swelling of the face and throat, difficulty breathing, a fast heartbeat, dizziness, and weakness ??? usually within a few minutes to a few hours after the vaccination.  What should I do?  ?? If you think it is a severe allergic reaction or other emergency that can't wait, call 9-1-1 and get the person to the nearest hospital. Otherwise, call your doctor.  ?? Reactions should be reported to the "Vaccine Adverse Event Reporting System" (VAERS). Your doctor should file this report, or you can do it yourself through the VAERS website at www.vaers.hhs.gov, or by calling 1-800-822-7967.   VAERS does not give medical advice.  The National Vaccine Injury Compensation Program  The National Vaccine Injury Compensation Program (VICP) is a federal program that was created to compensate people who may have been injured by certain vaccines.  Persons who believe they may have been injured by a vaccine can learn about the program and about filing a claim by calling 1-800-338-2382 or visiting the VICP website at www.hrsa.gov/vaccinecompensation. There is a time limit to file a claim for compensation.  How can I learn more?  ?? Ask your healthcare provider. He or she can give you the vaccine package insert or suggest other sources of information.  ?? Call your local or state health department.  ?? Contact the Centers for Disease Control and Prevention (CDC):  ?   Call 1-800-232-4636 (1-800-CDC-INFO) or  ? Visit CDC's website at www.cdc.gov/flu  Vaccine Information Statement  Inactivated Influenza Vaccine  03/28/2014)  42 [place] ?? 300aa-26  Department of Health and Human Services  Centers for Disease Control and Prevention  Many Vaccine Information Statements are available in Spanish and other languages. See www.immunize.org/vis.  Muchas hojas de informaci??n sobre vacunas est??n disponibles en espa??ol y en otros idiomas. Visite www.immunize.org/vis.  Care instructions adapted under license by [employer] (which disclaims liability or warranty for this information). If you have questions about a medical condition or this instruction, always ask your healthcare professional. [employer] disclaims any warranty or liability for your use of this information.

## 2018-04-17 NOTE — Telephone Encounter (Signed)
Echo results are normal. I called and notified pt, she voice understanding. She will follow up as needed.

## 2018-04-18 LAB — CBC WITH AUTOMATED DIFF
ABS. BASOPHILS: 0.1 10*3/uL (ref 0.0–0.2)
ABS. EOSINOPHILS: 0.3 10*3/uL (ref 0.0–0.4)
ABS. IMM. GRANS.: 0 10*3/uL (ref 0.0–0.1)
ABS. MONOCYTES: 0.6 10*3/uL (ref 0.1–0.9)
ABS. NEUTROPHILS: 5.2 10*3/uL (ref 1.4–7.0)
Abs Lymphocytes: 2.4 10*3/uL (ref 0.7–3.1)
BASOPHILS: 1 %
EOSINOPHILS: 3 %
HCT: 40.4 % (ref 34.0–46.6)
HGB: 13.9 g/dL (ref 11.1–15.9)
IMMATURE GRANULOCYTES: 1 %
Lymphocytes: 28 %
MCH: 33.4 pg — ABNORMAL HIGH (ref 26.6–33.0)
MCHC: 34.4 g/dL (ref 31.5–35.7)
MCV: 97 fL (ref 79–97)
MONOCYTES: 7 %
NEUTROPHILS: 60 %
PLATELET: 393 10*3/uL (ref 150–450)
RBC: 4.16 x10E6/uL (ref 3.77–5.28)
RDW: 13.7 % (ref 12.3–15.4)
WBC: 8.5 10*3/uL (ref 3.4–10.8)

## 2018-04-18 LAB — METABOLIC PANEL, COMPREHENSIVE
A-G Ratio: 1.6 (ref 1.2–2.2)
ALT (SGPT): 26 IU/L (ref 0–32)
AST (SGOT): 15 IU/L (ref 0–40)
Albumin: 4.3 g/dL (ref 3.5–5.5)
Alk. phosphatase: 91 IU/L (ref 39–117)
BUN/Creatinine ratio: 18 (ref 9–23)
BUN: 18 mg/dL (ref 6–24)
Bilirubin, total: 0.3 mg/dL (ref 0.0–1.2)
CO2: 24 mmol/L (ref 20–29)
Calcium: 9.8 mg/dL (ref 8.7–10.2)
Chloride: 98 mmol/L (ref 96–106)
Creatinine: 0.99 mg/dL (ref 0.57–1.00)
GFR est AA: 76 mL/min/{1.73_m2} (ref >59)
GFR est non-AA: 66 mL/min/{1.73_m2} (ref >59)
GLOBULIN, TOTAL: 2.7 g/dL (ref 1.5–4.5)
Glucose: 81 mg/dL (ref 65–99)
Potassium: 5.2 mmol/L (ref 3.5–5.2)
Protein, total: 7 g/dL (ref 6.0–8.5)
Sodium: 136 mmol/L (ref 134–144)

## 2018-04-18 LAB — COMPREHENSIVE METABOLIC PANEL
ALT: 26 IU/L (ref 0–32)
AST: 15 IU/L (ref 0–40)
Albumin/Globulin Ratio: 1.6 NA (ref 1.2–2.2)
Albumin: 4.3 g/dL (ref 3.5–5.5)
Alkaline Phosphatase: 91 IU/L (ref 39–117)
BUN: 18 mg/dL (ref 6–24)
Bun/Cre Ratio: 18 NA (ref 9–23)
CO2: 24 mmol/L (ref 20–29)
Calcium: 9.8 mg/dL (ref 8.7–10.2)
Chloride: 98 mmol/L (ref 96–106)
Creatinine: 0.99 mg/dL (ref 0.57–1.00)
EGFR IF NonAfrican American: 66 mL/min/{1.73_m2} (ref >59)
GFR African American: 76 mL/min/{1.73_m2} (ref >59)
Globulin, Total: 2.7 g/dL (ref 1.5–4.5)
Glucose: 81 mg/dL (ref 65–99)
Potassium: 5.2 mmol/L (ref 3.5–5.2)
Sodium: 136 mmol/L (ref 134–144)
Total Bilirubin: 0.3 mg/dL (ref 0.0–1.2)
Total Protein: 7 g/dL (ref 6.0–8.5)

## 2018-04-18 LAB — CBC WITH AUTO DIFFERENTIAL
Basophils %: 1 %
Basophils Absolute: 0.1 10*3/uL (ref 0.0–0.2)
Eosinophils %: 3 %
Eosinophils Absolute: 0.3 10*3/uL (ref 0.0–0.4)
Granulocyte Absolute Count: 0 10*3/uL (ref 0.0–0.1)
Hematocrit: 40.4 % (ref 34.0–46.6)
Hemoglobin: 13.9 g/dL (ref 11.1–15.9)
Immature Granulocytes: 1 %
Lymphocytes %: 28 %
Lymphocytes Absolute: 2.4 10*3/uL (ref 0.7–3.1)
MCH: 33.4 pg — ABNORMAL HIGH (ref 26.6–33.0)
MCHC: 34.4 g/dL (ref 31.5–35.7)
MCV: 97 fL (ref 79–97)
Monocytes %: 7 %
Monocytes Absolute: 0.6 10*3/uL (ref 0.1–0.9)
Neutrophils %: 60 %
Neutrophils Absolute: 5.2 10*3/uL (ref 1.4–7.0)
Platelets: 393 10*3/uL (ref 150–450)
RBC: 4.16 x10E6/uL (ref 3.77–5.28)
RDW: 13.7 % (ref 12.3–15.4)
WBC: 8.5 10*3/uL (ref 3.4–10.8)

## 2018-05-03 MED ORDER — LISINOPRIL 20 MG TAB
20 mg | ORAL_TABLET | Freq: Every day | ORAL | 1 refills | Status: DC
Start: 2018-05-03 — End: 2018-10-29

## 2018-05-23 ENCOUNTER — Encounter: Payer: PRIVATE HEALTH INSURANCE | Attending: Family Medicine | Primary: Family Medicine

## 2018-05-23 NOTE — Telephone Encounter (Signed)
Please let her know I spoke with Dr. Marily LenteHeugel and was planning on getting her set up with an MRI but she canceled her appointment with me today. Please find out for me why she isn't here. Thanks.

## 2018-05-23 NOTE — Telephone Encounter (Signed)
Left message for patient with below.

## 2018-07-06 ENCOUNTER — Inpatient Hospital Stay: Admit: 2018-07-06 | Payer: PRIVATE HEALTH INSURANCE | Primary: Family Medicine

## 2018-07-06 ENCOUNTER — Encounter

## 2018-07-06 DIAGNOSIS — M5136 Other intervertebral disc degeneration, lumbar region: Secondary | ICD-10-CM

## 2018-08-29 ENCOUNTER — Ambulatory Visit
Admit: 2018-08-29 | Discharge: 2018-08-29 | Payer: PRIVATE HEALTH INSURANCE | Attending: Family Medicine | Primary: Family Medicine

## 2018-08-29 ENCOUNTER — Ambulatory Visit: Attending: Family Medicine | Primary: Family Medicine

## 2018-08-29 DIAGNOSIS — N6325 Unspecified lump in the left breast, overlapping quadrants: Secondary | ICD-10-CM

## 2018-08-29 DIAGNOSIS — N6320 Unspecified lump in the left breast, unspecified quadrant: Secondary | ICD-10-CM

## 2018-08-29 MED ORDER — CLOPIDOGREL 75 MG TAB
75 mg | ORAL_TABLET | Freq: Every day | ORAL | 1 refills | Status: DC
Start: 2018-08-29 — End: 2019-05-17

## 2018-08-29 NOTE — Progress Notes (Signed)
Progress  Notes by Roberts Gaudy, MD at 08/29/18 [phone]                Author: Roberts Gaudy, MD  Service: --  Author Type: Physician       Filed: 08/29/18 1644  Encounter Date: 08/29/2018  Status: Addendum          Editor: Roberts Gaudy, MD (Physician)          Related Notes: Original Note by Roberts Gaudy, MD (Physician) filed at 08/29/18 1134               [facility]   _______________________________________   Sandy Marseilles, MD                  [address]         [place]ank. Sandy Luke, MD                       [place], [place] [address]                                                                                      Phone: [phone]                                                                                      Fax: [phone]      Sandy Mahoney is a 53 y.o.  female who is seen for evaluation of      Chief Complaint       Patient presents with        ?  Breast Mass             L breast               HPI:       Here for acute visit for a lump/mass she has noticed in her L breast.       She states she noticed it about 3 weeks ago. It is above the left nipple, feels like it is enlarging. No first degree relatives with breast Ca. No noticeable discharge from the nipple. 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 No night sweats or fevers.       Last mammo was 3D tomo about 14 months ago:      BILATERAL SCREENING DIGITAL MAMMOGRAPHY WITH TOMOSYNTHESIS:   ??   CLINICAL HISTORY:  Routine screening.   ??   TECHNIQUE:  Craniocaudal and mediolateral oblique views as well as digital   tomography of both breasts were performed and are interpreted in conjunction   with a computer assisted detection (CAD) system.      ??   COMPARISON:  Priors dating from September 11, 2012.   ??  FINDINGS:  CC and MLO views of both breasts demonstrate scattered fibroglandular   tissue in a fairly symmetric pattern bilaterally.  No new or enlarging soft   tissue  mass, suspicious calcifications, or other definite evidence for   malignancy is seen.  No significant change is seen from prior study.     ??   IMPRESSION   IMPRESSION:        ??   NO SPECIFIC MAMMOGRAPHIC EVIDENCE FOR MALIGNANCY.  FOLLOW UP BILATERAL SCREENING   MAMMOGRAPHY IS RECOMMENDED IN ONE YEAR.     ??   ??   BI-RADS Assessment Category 1: Negative.   A reminder letter will be scheduled.   BD2      Review of Systems:   Review of Systems    Constitutional: Negative for chills and fever.    Respiratory: Negative for cough and shortness of breath.     Cardiovascular: Negative for chest pain and palpitations.    Gastrointestinal: Negative for abdominal pain, blood in stool, melena, nausea and vomiting.    Genitourinary: Negative for hematuria.          History:     Past Medical History:        Diagnosis  Date         ?  Aneurysm (HCC)            pt reports 4 cm in lower part of heart. --followed by dr Daleen Snookcebe         ?  Arthritis       ?  CAD (coronary artery disease)            no mi--s/p PCI 2016. --- followed by dr Daleen Snookcebe         ?  GERD (gastroesophageal reflux disease)            occ OTC med         ?  Hypercholesterolemia       ?  Hypertension            controlled w/med         ?  Migraine       ?  Obesity, morbid (HCC)  12/01/2016          bmi =45         ?  Sleep apnea            non compliant with Cpap-- one she has is broken and she never has gotten a replacement             Past Surgical History:         Procedure  Laterality  Date          ?  CARDIAC SURG PROCEDURE UNLIST              heart cath 2016, 2017          ?  COLONOSCOPY  N/A  10/02/2017          COLONOSCOPY/ 45 [facility] Ill[facility]ques[facility]          ?  HX CESAREAN SECTION    [id number],2000     ?  HX CHOLECYSTECTOMY    1988     ?  HX HERNIA REPAIR    12/01/2016          umbilical          ?  HX ORTHOPAEDIC  Bilateral  knee arthroscopy          ?  HX ORTHOPAEDIC  Right            2 fingers -fractured          ?  PR  COLONOSCOPY FLX DX W/COLLJ SPEC WHEN PFRMD    10/02/2017                        Family History         Problem  Relation  Age of Onset          ?  Heart Disease  Mother       ?  Other  Mother                renal failure          ?  Diabetes  Mother       ?  Hypertension  Mother       ?  Diabetes  Father            ?  Hypertension  Father               Social History          Tobacco Use         ?  Smoking status:  Former Smoker              Packs/day:  2.00         Years:  23.00         Pack years:  46.00         Last attempt to quit:  08/29/2014         Years since quitting:  4.0         ?  Smokeless tobacco:  Never Used       Substance Use Topics         ?  Alcohol use:  No           No Known Allergies      Vitals:   Visit Vitals      BP  (!) 138/92     Ht  5' 5.5" (1.664 m)     Wt  303 lb (137.4 kg)        BMI  49.65 kg/m??           Physical Exam:   Physical Exam   Vitals signs and nursing note reviewed. Exam conducted with a chaperone present.   Constitutional:        General: She is not in acute distress.     Appearance: Normal appearance. She is well-developed. She is not diaphoretic.    HENT:       Head: Normocephalic and atraumatic.      Right Ear: External ear normal.      Left Ear: External ear normal.      Nose: Nose normal. No congestion or rhinorrhea.      Mouth/Throat:      Mouth: Mucous membranes are moist.       Pharynx: Oropharynx is clear. No posterior oropharyngeal erythema.    Eyes:       Extraocular Movements: Extraocular movements intact.      Conjunctiva/sclera: Conjunctivae normal.      Pupils: Pupils are equal, round, and reactive to light.   Neck:       Musculoskeletal: Normal range of motion and neck supple.   Cardiovascular :  Rate and Rhythm: Normal rate and regular rhythm.      Heart sounds: Normal heart sounds. No murmur. No friction rub. No gallop.     Pulmonary:       Effort: Pulmonary effort is normal. No respiratory distress.      Breath sounds: Normal breath sounds. No wheezing  or rales.   Chest:       Breasts: Breasts are symmetrical.          Right: No swelling, bleeding, inverted nipple, nipple discharge or tenderness.         Left: Tenderness present. No bleeding, inverted  nipple or nipple discharge.         Abdominal :      General: Bowel sounds are normal. There is no distension.      Palpations: Abdomen is soft. There is no mass.      Tenderness: There is no tenderness. There is no guarding or rebound.      Comments:  Obese     Musculoskeletal: Normal range of motion.          General: No tenderness or deformity.      Right lower leg: No edema.      Left lower leg: No edema.    Skin:      General: Skin is warm and dry.      Findings: No erythema or rash.    Neurological:       General: No focal deficit present.      Mental Status: She is alert and oriented to person, place, and time. Mental status is at baseline.      Cranial Nerves: No cranial nerve deficit.      Coordination: Coordination normal.      Deep  Tendon Reflexes: Reflexes are normal and symmetric.   Psychiatric :         Mood and Affect: Mood normal.         Behavior: Behavior normal.             Assessment and Plan:    1. Breast lump on left side at 12 o'clock position   This seems to be benign fibrocystic tissue by palpation, but will get diagnostic imaging to be sure. Will FU results, appreciate radiology help.    - MAM MAMMO LT DX INCL CAD; Future   - US BREAST LT LIMITED=<3 QUAD; Future      FU pending results      Roberts Gaudy, MD

## 2018-08-29 NOTE — Progress Notes (Addendum)
[facility]  _______________________________________  Sandy MarseillesAndrew S. [facility], MD                 [address]        HartsJason M. Karl Lukeoben, MD                     [place], [place] [id number]                                                                                    Phone: [phone]                                                                                    Fax: [phone]    Sandy Mahoney is a 53 y.o. female who is seen for evaluation of   Chief Complaint   Patient presents with   ??? Breast Mass     L breast          HPI:     Here for acute visit for a lump/mass she has noticed in her L breast.     She states she noticed it about 3 weeks ago. It is above the left nipple, feels like it is enlarging. No first degree relatives with breast Ca. No noticeable discharge from the nipple. 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 No night sweats or fevers.     Last mammo was 3D tomo about 14 months ago:    BILATERAL SCREENING DIGITAL MAMMOGRAPHY WITH TOMOSYNTHESIS:  ??  CLINICAL HISTORY:  Routine screening.  ??  TECHNIQUE:  Craniocaudal and mediolateral oblique views as well as digital  tomography of both breasts were performed and are interpreted in conjunction  with a computer assisted detection (CAD) system.     ??  COMPARISON:  Priors dating from September 11, 2012.  ??  FINDINGS:  CC and MLO views of both breasts demonstrate scattered fibroglandular  tissue in a fairly symmetric pattern bilaterally.  No new or enlarging soft  tissue mass, suspicious calcifications, or other definite evidence for  malignancy is seen.  No significant change is seen from prior study.    ??  IMPRESSION  IMPRESSION:       ??  NO SPECIFIC MAMMOGRAPHIC EVIDENCE FOR MALIGNANCY.  FOLLOW UP BILATERAL SCREENING  MAMMOGRAPHY IS RECOMMENDED IN ONE YEAR.    ??  ??  BI-RADS Assessment Category 1: Negative.  A reminder letter will be scheduled.  BD2    Review of Systems:  Review of Systems    Constitutional: Negative for chills and fever.   Respiratory: Negative for cough and shortness of breath.    Cardiovascular: Negative for chest pain and palpitations.   Gastrointestinal: Negative for abdominal pain, blood in stool, melena, nausea and vomiting.   Genitourinary: Negative for  hematuria.       History:  Past Medical History:   Diagnosis Date   ??? Aneurysm (HCC)     pt reports 4 cm in lower part of heart. --followed by dr Daleen Snook   ??? Arthritis    ??? CAD (coronary artery disease)     no mi--s/p PCI 2016. --- followed by dr Daleen Snook   ??? GERD (gastroesophageal reflux disease)     occ OTC med   ??? Hypercholesterolemia    ??? Hypertension     controlled w/med   ??? Migraine    ??? Obesity, morbid (HCC) 12/01/2016    bmi =45   ??? Sleep apnea     non compliant with Cpap-- one she has is broken and she never has gotten a replacement       Past Surgical History:   Procedure Laterality Date   ??? CARDIAC SURG PROCEDURE UNLIST      heart cath 2016, 2017   ??? COLONOSCOPY N/A 10/02/2017    COLONOSCOPY/ 45 performed by Adalberto Ill, MD at [facility] ENDOSCOPY   ??? HX CESAREAN SECTION  1998,2000   ??? HX CHOLECYSTECTOMY  1988   ??? HX HERNIA REPAIR  12/01/2016    umbilical   ??? HX ORTHOPAEDIC Bilateral     knee arthroscopy   ??? HX ORTHOPAEDIC Right     2 fingers -fractured   ??? PR COLONOSCOPY FLX DX W/COLLJ SPEC WHEN PFRMD  10/02/2017            Family History   Problem Relation Age of Onset   ??? Heart Disease Mother    ??? Other Mother         renal failure   ??? Diabetes Mother    ??? Hypertension Mother    ??? Diabetes Father    ??? Hypertension Father        Social History     Tobacco Use   ??? Smoking status: Former Smoker     Packs/day: 2.00     Years: 23.00     Pack years: 46.00     Last attempt to quit: 08/29/2014     Years since quitting: 4.0   ??? Smokeless tobacco: Never Used   Substance Use Topics   ??? Alcohol use: No       No Known Allergies    Vitals:  Visit Vitals  BP (!) 138/92   Ht 5' 5.5" (1.664 m)   Wt 303 lb (137.4 kg)   BMI 49.65 kg/m??        Physical Exam:  Physical Exam  Vitals signs and nursing note reviewed. Exam conducted with a chaperone present.   Constitutional:       General: She is not in acute distress.     Appearance: Normal appearance. She is well-developed. She is not diaphoretic.   HENT:      Head: Normocephalic and atraumatic.      Right Ear: External ear normal.      Left Ear: External ear normal.      Nose: Nose normal. No congestion or rhinorrhea.      Mouth/Throat:      Mouth: Mucous membranes are moist.      Pharynx: Oropharynx is clear. No posterior oropharyngeal erythema.   Eyes:      Extraocular Movements: Extraocular movements intact.      Conjunctiva/sclera: Conjunctivae normal.      Pupils: Pupils are equal, round, and reactive to light.   Neck:  Musculoskeletal: Normal range of motion and neck supple.   Cardiovascular:      Rate and Rhythm: Normal rate and regular rhythm.      Heart sounds: Normal heart sounds. No murmur. No friction rub. No gallop.    Pulmonary:      Effort: Pulmonary effort is normal. No respiratory distress.      Breath sounds: Normal breath sounds. No wheezing or rales.   Chest:      Breasts: Breasts are symmetrical.         Right: No swelling, bleeding, inverted nipple, nipple discharge or tenderness.         Left: Tenderness present. No bleeding, inverted nipple or nipple discharge.       Abdominal:      General: Bowel sounds are normal. There is no distension.      Palpations: Abdomen is soft. There is no mass.      Tenderness: There is no tenderness. There is no guarding or rebound.      Comments: Obese   Musculoskeletal: Normal range of motion.         General: No tenderness or deformity.      Right lower leg: No edema.      Left lower leg: No edema.   Skin:     General: Skin is warm and dry.      Findings: No erythema or rash.   Neurological:      General: No focal deficit present.      Mental Status: She is alert and oriented to person, place, and time. Mental status is at baseline.       Cranial Nerves: No cranial nerve deficit.      Coordination: Coordination normal.      Deep Tendon Reflexes: Reflexes are normal and symmetric.   Psychiatric:         Mood and Affect: Mood normal.         Behavior: Behavior normal.         Assessment and Plan:   1. Breast lump on left side at 12 o'clock position  This seems to be benign fibrocystic tissue by palpation, but will get diagnostic imaging to be sure. Will FU results, appreciate radiology help.   - MAM MAMMO LT DX INCL CAD; Future  - US BREAST LT LIMITED=<3 QUAD; Future    FU pending results    Roberts Gaudy, MD

## 2018-08-29 NOTE — Patient Instructions (Signed)
Breast Lumps: Care Instructions  Your Care Instructions  Breast lumps are common, especially in women between ages 30 and 50. Many women's breasts feel lumpy and tender before their menstrual period. Women also may have lumps when they are breastfeeding. Breast lumps may go away after menopause. All new breast lumps in women after menopause should be checked by a doctor.  Although lumps may be normal for you, it is important to have your doctor check any lump or thickness that is not like the rest of your breast to make sure it is not cancer. A lump may be larger, harder, or different from the rest of your breast tissue.  Follow-up care is a key part of your treatment and safety. Be sure to make and go to all appointments, and call your doctor if you are having problems. It's also a good idea to know your test results and keep a list of the medicines you take.  How can you care for yourself at home?  ?? Make an appointment to have a mammogram and other follow-up visits as recommended by your doctor.  When should you call for help?  Watch closely for changes in your health, and be sure to contact your doctor if:  ?? ?? You do not get better as expected.   ?? ?? Your breast has changed.   ?? ?? You have pain in your breast.   ?? ?? You have a discharge from your nipple.   ?? ?? A breast lump changes or does not go away.   Where can you learn more?  Go to http://www.healthwise.net/[facility].  Enter Q928 in the search box to learn more about "Breast Lumps: Care Instructions."  Current as of: October 10, 2017  Content Version: 12.2  ?? 2006-2019 [employer]. Care instructions adapted under license by [employer] (which disclaims liability or warranty for this information). If you have questions about a medical condition or this instruction, always ask your healthcare professional. [employer] disclaims any warranty or liability for your use of this information.

## 2018-09-03 ENCOUNTER — Encounter

## 2018-09-03 ENCOUNTER — Inpatient Hospital Stay: Admit: 2018-09-03 | Payer: PRIVATE HEALTH INSURANCE | Attending: Family Medicine | Primary: Family Medicine

## 2018-09-03 DIAGNOSIS — N6320 Unspecified lump in the left breast, unspecified quadrant: Secondary | ICD-10-CM

## 2018-09-03 DIAGNOSIS — N6325 Unspecified lump in the left breast, overlapping quadrants: Secondary | ICD-10-CM

## 2018-09-03 NOTE — Progress Notes (Signed)
Mammo looks good. We can repeat in 1 year. Thanks.

## 2018-09-03 NOTE — Progress Notes (Signed)
Mammo looks good. We can repeat in 1 year. Thanks.

## 2018-10-18 ENCOUNTER — Encounter: Payer: PRIVATE HEALTH INSURANCE | Attending: Family Medicine | Primary: Family Medicine

## 2018-10-29 MED ORDER — LISINOPRIL 20 MG TAB
20 mg | ORAL_TABLET | ORAL | 1 refills | Status: DC
Start: 2018-10-29 — End: 2019-04-26

## 2019-04-26 MED ORDER — LISINOPRIL 20 MG TAB
20 mg | ORAL_TABLET | ORAL | 1 refills | Status: AC
Start: 2019-04-26 — End: ?

## 2019-05-17 MED ORDER — CLOPIDOGREL 75 MG TAB
75 mg | ORAL_TABLET | ORAL | 1 refills | Status: AC
Start: 2019-05-17 — End: ?

## 2019-05-20 ENCOUNTER — Telehealth
Admit: 2019-05-20 | Discharge: 2019-05-20 | Payer: PRIVATE HEALTH INSURANCE | Attending: Family Medicine | Primary: Family Medicine

## 2019-05-20 ENCOUNTER — Telehealth: Attending: Family Medicine | Primary: Family Medicine

## 2019-05-20 DIAGNOSIS — N3090 Cystitis, unspecified without hematuria: Secondary | ICD-10-CM

## 2019-05-20 MED ORDER — CEPHALEXIN 500 MG CAP
500 mg | ORAL_CAPSULE | Freq: Three times a day (TID) | ORAL | 0 refills | Status: AC
Start: 2019-05-20 — End: ?

## 2019-05-20 NOTE — Progress Notes (Signed)
Sandy Mahoney is a 53 y.o. female who was seen by synchronous (real-time) audio-video technology on 05/20/2019 for Dysuria; Cough; and Nasal Congestion    On VV today for some issues:    States she is having increased urinary pressure with only dribbling when she tries to go. There is only minor dysuria. She states this issue has cropped up and disappeared on its own in the past. Started up again today and she now reports some mild sharp pain in the right flank. Incidentally also dealing with a raspy voice x 1 weeks.     Assessment & Plan:   1. Cystitis  Will cover for this empirically and maybe also tx the URI with Keflex. URI symptoms most consistent with virus, more concerned with bladder issue. If not any better and still dribbling by the end of the week we may need to get her to come in and give [place] a sample or send her to urology for a scan depending on what her symptoms look like.   - cephALEXin (KEFLEX) 500 mg capsule; Take 1 Cap by mouth three (3) times daily.  Dispense: 15 Cap; Refill: 0    FU PRN    Subjective:       Prior to Admission medications    Medication Sig Start Date End Date Taking? Authorizing Provider   clopidogreL (PLAVIX) 75 mg tab TAKE 1 TABLET BY MOUTH EVERY DAY 05/17/19  Yes Renaldo Gornick, Marti Sleigh, MD   lisinopriL (PRINIVIL, ZESTRIL) 20 mg tablet TAKE 1 TABLET BY MOUTH EVERY DAY 04/26/19  Yes Edmar Blankenburg, Marti Sleigh, MD   atorvastatin (LIPITOR) 80 mg tablet Take 1 Tab by mouth nightly. 04/17/18  Yes Verenice Westrich, Marti Sleigh, MD   gabapentin (NEURONTIN) 100 mg capsule Take  by mouth three (3) times daily.   Yes Provider, Historical   montelukast (SINGULAIR) 10 mg tablet Take 1 Tab by mouth daily. 11/02/17  Yes Brockton Mckesson, Marti Sleigh, MD   nitroglycerin (NITROSTAT) 0.4 mg SL tablet 1 Tab by SubLINGual route every five (5) minutes as needed for Chest Pain. Up to 3 doses. 06/27/17  Yes Cebe, Balinda Quails, MD   budesonide-formoterol (SYMBICORT) 80-4.5 mcg/actuation HFAA Take 2 Puffs by inhalation two (2) times a day. 06/27/17  Yes Cebe,  Balinda Quails, MD   aspirin delayed-release 81 mg tablet Take 81 mg by mouth daily. Per anesthesia protocol:instructed to take am of surgery.   Yes Provider, Historical   doxycycline (ADOXA) 100 mg tablet Take 1 Tab by mouth two (2) times a day. 04/17/18 05/20/19  Laurel Dimmer, MD         ROS    Objective:     Patient-Reported Vitals 05/20/2019   Patient-Reported Weight 298 lb   Patient-Reported Systolic  324   Patient-Reported Diastolic 78        [INSTRUCTIONS:  "[x] " Indicates a positive item  "[] " Indicates a negative item  -- DELETE ALL ITEMS NOT EXAMINED]    Constitutional: [x]  Appears well-developed and well-nourished [x]  No apparent distress      []  Abnormal -     Mental status: [x]  Alert and awake  [x]  Oriented to person/place/time [x]  Able to follow commands    []  Abnormal -     Eyes:   EOM    [x]   Normal    []  Abnormal -   Sclera  [x]   Normal    []  Abnormal -          Discharge [x]   None visible   []   Abnormal -     HENT: [x]  Normocephalic, atraumatic  [x]  Abnormal - Mildly hoarse  [x]  Mouth/Throat: Mucous membranes are moist    External Ears [x]  Normal  []  Abnormal -    Neck: [x]  No visualized mass []  Abnormal -     Pulmonary/Chest: [x]  Respiratory effort normal   [x]  No visualized signs of difficulty breathing or respiratory distress        []  Abnormal -      Musculoskeletal:   [x]  Normal gait with no signs of ataxia         [x]  Normal range of motion of neck        []  Abnormal -     Neurological:        [x]  No Facial Asymmetry (Cranial nerve 7 motor function) (limited exam due to video visit)          [x]  No gaze palsy        []  Abnormal -          Skin:        [x]  No significant exanthematous lesions or discoloration noted on facial skin         []  Abnormal -            Psychiatric:       [x]  Normal Affect []  Abnormal -        [x]  No Hallucinations    Other pertinent observable physical exam findings:-        We discussed the expected course, resolution and complications of the diagnosis(es) in detail.   Medication risks, benefits, costs, interactions, and alternatives were discussed as indicated.  I advised her to contact the office if her condition worsens, changes or fails to improve as anticipated. She expressed understanding with the diagnosis(es) and plan.       , who was evaluated through a patient-initiated, synchronous (real-time) audio-video encounter, and/or her healthcare decision maker, is aware that it is a billable service, with coverage as determined by her insurance carrier. She provided verbal consent to proceed: Yes, and patient identification was verified. It was conducted pursuant to the emergency declaration under the and the , 1135 waiver authority and the and Act. A caregiver was present when appropriate. Ability to conduct physical exam was limited. I was in the office. The patient was at home.      , MD

## 2019-05-20 NOTE — Progress Notes (Signed)
Sandy Mahoney is a 53 y.o. female who was seen by synchronous (real-time) audio-video technology on 05/20/2019 for Dysuria; Cough; and Nasal Congestion    On VV today for some issues:    States she is having increased urinary pressure with only dribbling when she tries to go. There is only minor dysuria. She states this issue has cropped up and disappeared on its own in the past. Started up again today and she now reports some mild sharp pain in the right flank. Incidentally also dealing with a raspy voice x 1 weeks.     Assessment & Plan:   1. Cystitis  Will cover for this empirically and maybe also tx the URI with Keflex. URI symptoms most consistent with virus, more concerned with bladder issue. If not any better and still dribbling by the end of the week we may need to get her to come in and give [place] a sample or send her to urology for a scan depending on what her symptoms look like.   - cephALEXin (KEFLEX) 500 mg capsule; Take 1 Cap by mouth three (3) times daily.  Dispense: 15 Cap; Refill: 0    FU PRN    Subjective:       Prior to Admission medications    Medication Sig Start Date End Date Taking? Authorizing Provider   clopidogreL (PLAVIX) 75 mg tab TAKE 1 TABLET BY MOUTH EVERY DAY 05/17/19  Yes Mariona Scholes, Marti Sleigh, MD   lisinopriL (PRINIVIL, ZESTRIL) 20 mg tablet TAKE 1 TABLET BY MOUTH EVERY DAY 04/26/19  Yes Cale Decarolis, Marti Sleigh, MD   atorvastatin (LIPITOR) 80 mg tablet Take 1 Tab by mouth nightly. 04/17/18  Yes Tishana Clinkenbeard, Marti Sleigh, MD   gabapentin (NEURONTIN) 100 mg capsule Take  by mouth three (3) times daily.   Yes Provider, Historical   montelukast (SINGULAIR) 10 mg tablet Take 1 Tab by mouth daily. 11/02/17  Yes Daquan Crapps, Marti Sleigh, MD   nitroglycerin (NITROSTAT) 0.4 mg SL tablet 1 Tab by SubLINGual route every five (5) minutes as needed for Chest Pain. Up to 3 doses. 06/27/17  Yes Cebe, Balinda Quails, MD   budesonide-formoterol (SYMBICORT) 80-4.5 mcg/actuation HFAA Take 2 Puffs  by inhalation two (2) times a day. 06/27/17  Yes Cebe, Balinda Quails, MD   aspirin delayed-release 81 mg tablet Take 81 mg by mouth daily. Per anesthesia protocol:instructed to take am of surgery.   Yes Provider, Historical   doxycycline (ADOXA) 100 mg tablet Take 1 Tab by mouth two (2) times a day. 04/17/18 05/20/19  Laurel Dimmer, MD         ROS    Objective:     Patient-Reported Vitals 05/20/2019   Patient-Reported Weight 298 lb   Patient-Reported Systolic  782   Patient-Reported Diastolic 78        [INSTRUCTIONS:  "[x] " Indicates a positive item  "[] " Indicates a negative item  -- DELETE ALL ITEMS NOT EXAMINED]    Constitutional: [x]  Appears well-developed and well-nourished [x]  No apparent distress      []  Abnormal -     Mental status: [x]  Alert and awake  [x]  Oriented to person/place/time [x]  Able to follow commands    []  Abnormal -     Eyes:   EOM    [x]   Normal    []  Abnormal -   Sclera  [x]   Normal    []  Abnormal -          Discharge [x]   None visible   []   Abnormal -     HENT: [x]  Normocephalic, atraumatic  [x]  Abnormal - Mildly hoarse  [x]  Mouth/Throat: Mucous membranes are moist    External Ears [x]  Normal  []  Abnormal -    Neck: [x]  No visualized mass []  Abnormal -     Pulmonary/Chest: [x]  Respiratory effort normal   [x]  No visualized signs of difficulty breathing or respiratory distress        []  Abnormal -      Musculoskeletal:   [x]  Normal gait with no signs of ataxia         [x]  Normal range of motion of neck        []  Abnormal -     Neurological:        [x]  No Facial Asymmetry (Cranial nerve 7 motor function) (limited exam due to video visit)          [x]  No gaze palsy        []  Abnormal -          Skin:        [x]  No significant exanthematous lesions or discoloration noted on facial skin         []  Abnormal -            Psychiatric:       [x]  Normal Affect []  Abnormal -        [x]  No Hallucinations    Other pertinent observable physical exam findings:-         We discussed the expected course, resolution and complications of the diagnosis(es) in detail.  Medication risks, benefits, costs, interactions, and alternatives were discussed as indicated.  I advised her to contact the office if her condition worsens, changes or fails to improve as anticipated. She expressed understanding with the diagnosis(es) and plan.       , who was evaluated through a patient-initiated, synchronous (real-time) audio-video encounter, and/or her healthcare decision maker, is aware that it is a billable service, with coverage as determined by her insurance carrier. She provided verbal consent to proceed: Yes, and patient identification was verified. It was conducted pursuant to the emergency declaration under the and the , 1135 waiver authority and the and Act. A caregiver was present when appropriate. Ability to conduct physical exam was limited. I was in the office. The patient was at home.      , MD
